# Patient Record
Sex: Female | Born: 1955 | Race: Black or African American | Hispanic: No | Marital: Single | Smoking: Former smoker
Health system: Southern US, Community
[De-identification: ages and names within clinical notes are randomized; demographics above are authoritative.]

## PROBLEM LIST (undated history)

## (undated) DIAGNOSIS — J45909 Unspecified asthma, uncomplicated: Secondary | ICD-10-CM

## (undated) DIAGNOSIS — I10 Essential (primary) hypertension: Secondary | ICD-10-CM

## (undated) DIAGNOSIS — K219 Gastro-esophageal reflux disease without esophagitis: Secondary | ICD-10-CM

## (undated) DIAGNOSIS — F329 Major depressive disorder, single episode, unspecified: Secondary | ICD-10-CM

## (undated) DIAGNOSIS — M549 Dorsalgia, unspecified: Secondary | ICD-10-CM

## (undated) DIAGNOSIS — F32A Depression, unspecified: Secondary | ICD-10-CM

## (undated) DIAGNOSIS — G8929 Other chronic pain: Secondary | ICD-10-CM

## (undated) HISTORY — DX: Essential (primary) hypertension: I10

## (undated) HISTORY — DX: Dorsalgia, unspecified: M54.9

## (undated) HISTORY — DX: Other chronic pain: G89.29

## (undated) HISTORY — PX: ABDOMINAL HYSTERECTOMY: SHX81

## (undated) HISTORY — DX: Depression, unspecified: F32.A

## (undated) HISTORY — PX: SHOULDER SURGERY: SHX246

## (undated) HISTORY — PX: OTHER SURGICAL HISTORY: SHX169

## (undated) HISTORY — DX: Unspecified asthma, uncomplicated: J45.909

## (undated) HISTORY — PX: LUMBAR FUSION: SHX111

## (undated) HISTORY — DX: Gastro-esophageal reflux disease without esophagitis: K21.9

## (undated) HISTORY — DX: Major depressive disorder, single episode, unspecified: F32.9

---

## 1998-02-03 ENCOUNTER — Emergency Department (HOSPITAL_COMMUNITY): Admission: EM | Admit: 1998-02-03 | Discharge: 1998-02-03 | Payer: Self-pay | Admitting: Emergency Medicine

## 1998-02-04 ENCOUNTER — Emergency Department (HOSPITAL_COMMUNITY): Admission: EM | Admit: 1998-02-04 | Discharge: 1998-02-04 | Payer: Self-pay | Admitting: Emergency Medicine

## 1998-02-08 ENCOUNTER — Ambulatory Visit (HOSPITAL_COMMUNITY): Admission: RE | Admit: 1998-02-08 | Discharge: 1998-02-08 | Payer: Self-pay | Admitting: Cardiology

## 1998-02-15 ENCOUNTER — Other Ambulatory Visit: Admission: RE | Admit: 1998-02-15 | Discharge: 1998-02-15 | Payer: Self-pay | Admitting: Obstetrics and Gynecology

## 1998-09-21 ENCOUNTER — Inpatient Hospital Stay (HOSPITAL_COMMUNITY): Admission: RE | Admit: 1998-09-21 | Discharge: 1998-09-23 | Payer: Self-pay | Admitting: Obstetrics and Gynecology

## 1998-09-29 ENCOUNTER — Encounter: Payer: Self-pay | Admitting: Emergency Medicine

## 1998-09-29 ENCOUNTER — Inpatient Hospital Stay (HOSPITAL_COMMUNITY): Admission: EM | Admit: 1998-09-29 | Discharge: 1998-10-10 | Payer: Self-pay | Admitting: Emergency Medicine

## 1998-09-30 ENCOUNTER — Encounter: Payer: Self-pay | Admitting: Obstetrics & Gynecology

## 1998-10-05 ENCOUNTER — Encounter: Payer: Self-pay | Admitting: Obstetrics and Gynecology

## 1998-10-06 ENCOUNTER — Encounter: Payer: Self-pay | Admitting: Obstetrics and Gynecology

## 1999-05-23 ENCOUNTER — Ambulatory Visit (HOSPITAL_COMMUNITY): Admission: RE | Admit: 1999-05-23 | Discharge: 1999-05-23 | Payer: Self-pay | Admitting: Orthopedic Surgery

## 1999-05-23 ENCOUNTER — Encounter: Payer: Self-pay | Admitting: Orthopedic Surgery

## 1999-06-04 DIAGNOSIS — J45909 Unspecified asthma, uncomplicated: Secondary | ICD-10-CM | POA: Insufficient documentation

## 1999-06-18 ENCOUNTER — Encounter: Payer: Self-pay | Admitting: Orthopedic Surgery

## 1999-06-18 ENCOUNTER — Ambulatory Visit (HOSPITAL_COMMUNITY): Admission: RE | Admit: 1999-06-18 | Discharge: 1999-06-18 | Payer: Self-pay | Admitting: Orthopedic Surgery

## 1999-07-02 ENCOUNTER — Ambulatory Visit (HOSPITAL_COMMUNITY): Admission: RE | Admit: 1999-07-02 | Discharge: 1999-07-02 | Payer: Self-pay | Admitting: Orthopedic Surgery

## 1999-07-02 ENCOUNTER — Encounter: Payer: Self-pay | Admitting: Orthopedic Surgery

## 1999-07-16 ENCOUNTER — Encounter: Payer: Self-pay | Admitting: Orthopedic Surgery

## 1999-07-16 ENCOUNTER — Emergency Department (HOSPITAL_COMMUNITY): Admission: EM | Admit: 1999-07-16 | Discharge: 1999-07-16 | Payer: Self-pay | Admitting: Emergency Medicine

## 1999-07-16 ENCOUNTER — Ambulatory Visit (HOSPITAL_COMMUNITY): Admission: RE | Admit: 1999-07-16 | Discharge: 1999-07-16 | Payer: Self-pay | Admitting: Orthopedic Surgery

## 1999-07-24 ENCOUNTER — Encounter: Payer: Self-pay | Admitting: Physical Therapy

## 1999-07-24 ENCOUNTER — Ambulatory Visit (HOSPITAL_COMMUNITY): Admission: RE | Admit: 1999-07-24 | Discharge: 1999-07-24 | Payer: Self-pay | Admitting: Physical Therapy

## 1999-08-22 ENCOUNTER — Encounter: Payer: Self-pay | Admitting: Internal Medicine

## 1999-08-29 ENCOUNTER — Encounter: Payer: Self-pay | Admitting: Orthopedic Surgery

## 1999-09-04 ENCOUNTER — Inpatient Hospital Stay (HOSPITAL_COMMUNITY): Admission: RE | Admit: 1999-09-04 | Discharge: 1999-09-08 | Payer: Self-pay | Admitting: Orthopedic Surgery

## 1999-09-04 ENCOUNTER — Encounter: Payer: Self-pay | Admitting: Orthopedic Surgery

## 1999-12-21 ENCOUNTER — Emergency Department (HOSPITAL_COMMUNITY): Admission: EM | Admit: 1999-12-21 | Discharge: 1999-12-21 | Payer: Self-pay | Admitting: Emergency Medicine

## 2001-06-29 ENCOUNTER — Encounter: Admission: RE | Admit: 2001-06-29 | Discharge: 2001-09-27 | Payer: Self-pay | Admitting: Orthopedic Surgery

## 2001-07-24 ENCOUNTER — Encounter: Payer: Self-pay | Admitting: Orthopedic Surgery

## 2001-07-24 ENCOUNTER — Ambulatory Visit (HOSPITAL_COMMUNITY): Admission: RE | Admit: 2001-07-24 | Discharge: 2001-07-24 | Payer: Self-pay | Admitting: Orthopedic Surgery

## 2001-09-10 ENCOUNTER — Emergency Department (HOSPITAL_COMMUNITY): Admission: EM | Admit: 2001-09-10 | Discharge: 2001-09-10 | Payer: Self-pay | Admitting: Emergency Medicine

## 2002-02-26 ENCOUNTER — Emergency Department (HOSPITAL_COMMUNITY): Admission: EM | Admit: 2002-02-26 | Discharge: 2002-02-26 | Payer: Self-pay | Admitting: Emergency Medicine

## 2002-02-26 ENCOUNTER — Encounter: Payer: Self-pay | Admitting: Emergency Medicine

## 2002-08-01 ENCOUNTER — Emergency Department (HOSPITAL_COMMUNITY): Admission: EM | Admit: 2002-08-01 | Discharge: 2002-08-01 | Payer: Self-pay | Admitting: Emergency Medicine

## 2002-10-11 ENCOUNTER — Encounter: Admission: RE | Admit: 2002-10-11 | Discharge: 2002-10-11 | Payer: Self-pay | Admitting: Internal Medicine

## 2002-10-11 ENCOUNTER — Encounter: Payer: Self-pay | Admitting: Internal Medicine

## 2002-12-16 ENCOUNTER — Encounter: Payer: Self-pay | Admitting: Internal Medicine

## 2002-12-16 HISTORY — PX: ESOPHAGOGASTRODUODENOSCOPY: SHX1529

## 2003-03-07 ENCOUNTER — Emergency Department (HOSPITAL_COMMUNITY): Admission: EM | Admit: 2003-03-07 | Discharge: 2003-03-08 | Payer: Self-pay | Admitting: Emergency Medicine

## 2003-03-07 ENCOUNTER — Encounter: Payer: Self-pay | Admitting: Emergency Medicine

## 2003-03-08 ENCOUNTER — Encounter: Payer: Self-pay | Admitting: Internal Medicine

## 2004-08-02 ENCOUNTER — Ambulatory Visit (HOSPITAL_BASED_OUTPATIENT_CLINIC_OR_DEPARTMENT_OTHER): Admission: RE | Admit: 2004-08-02 | Discharge: 2004-08-02 | Payer: Self-pay | Admitting: Otolaryngology

## 2004-08-02 ENCOUNTER — Ambulatory Visit: Payer: Self-pay | Admitting: Internal Medicine

## 2004-08-24 ENCOUNTER — Ambulatory Visit: Payer: Self-pay | Admitting: Critical Care Medicine

## 2004-09-21 ENCOUNTER — Ambulatory Visit (HOSPITAL_COMMUNITY): Admission: RE | Admit: 2004-09-21 | Discharge: 2004-09-22 | Payer: Self-pay | Admitting: Otolaryngology

## 2004-09-21 ENCOUNTER — Encounter (INDEPENDENT_AMBULATORY_CARE_PROVIDER_SITE_OTHER): Payer: Self-pay | Admitting: *Deleted

## 2005-02-07 ENCOUNTER — Ambulatory Visit: Payer: Self-pay | Admitting: Internal Medicine

## 2005-04-05 ENCOUNTER — Emergency Department (HOSPITAL_COMMUNITY): Admission: EM | Admit: 2005-04-05 | Discharge: 2005-04-05 | Payer: Self-pay | Admitting: Emergency Medicine

## 2005-06-28 ENCOUNTER — Ambulatory Visit (HOSPITAL_COMMUNITY): Admission: RE | Admit: 2005-06-28 | Discharge: 2005-06-28 | Payer: Self-pay | Admitting: Neurosurgery

## 2005-08-05 ENCOUNTER — Ambulatory Visit: Payer: Self-pay | Admitting: Internal Medicine

## 2005-08-08 ENCOUNTER — Observation Stay (HOSPITAL_COMMUNITY): Admission: EM | Admit: 2005-08-08 | Discharge: 2005-08-08 | Payer: Self-pay | Admitting: Emergency Medicine

## 2005-08-14 ENCOUNTER — Ambulatory Visit: Payer: Self-pay | Admitting: Internal Medicine

## 2005-08-21 ENCOUNTER — Ambulatory Visit: Payer: Self-pay

## 2005-08-21 ENCOUNTER — Encounter: Payer: Self-pay | Admitting: Internal Medicine

## 2005-10-02 ENCOUNTER — Encounter: Payer: Self-pay | Admitting: Cardiology

## 2005-12-15 ENCOUNTER — Encounter: Payer: Self-pay | Admitting: Emergency Medicine

## 2005-12-15 ENCOUNTER — Inpatient Hospital Stay (HOSPITAL_COMMUNITY): Admission: AD | Admit: 2005-12-15 | Discharge: 2005-12-19 | Payer: Self-pay | Admitting: Internal Medicine

## 2005-12-17 ENCOUNTER — Ambulatory Visit: Payer: Self-pay | Admitting: Internal Medicine

## 2006-02-25 ENCOUNTER — Emergency Department (HOSPITAL_COMMUNITY): Admission: EM | Admit: 2006-02-25 | Discharge: 2006-02-25 | Payer: Self-pay | Admitting: Hematology & Oncology

## 2006-03-26 ENCOUNTER — Ambulatory Visit: Payer: Self-pay | Admitting: Internal Medicine

## 2006-11-28 ENCOUNTER — Emergency Department (HOSPITAL_COMMUNITY): Admission: EM | Admit: 2006-11-28 | Discharge: 2006-11-28 | Payer: Self-pay | Admitting: Emergency Medicine

## 2007-01-29 DIAGNOSIS — K219 Gastro-esophageal reflux disease without esophagitis: Secondary | ICD-10-CM | POA: Insufficient documentation

## 2007-01-29 DIAGNOSIS — D259 Leiomyoma of uterus, unspecified: Secondary | ICD-10-CM | POA: Insufficient documentation

## 2007-01-29 DIAGNOSIS — I10 Essential (primary) hypertension: Secondary | ICD-10-CM | POA: Insufficient documentation

## 2007-03-06 ENCOUNTER — Telehealth: Payer: Self-pay | Admitting: Internal Medicine

## 2007-05-01 IMAGING — CR DG CHEST 2V
2 series · 2 of 2 positions shown · non-contrast
Comparison: 08/07/05.

CLINICAL DATA: Dyspnea. Cough and congestion. 
 CHEST - 2 VIEW:

[w chest pa]
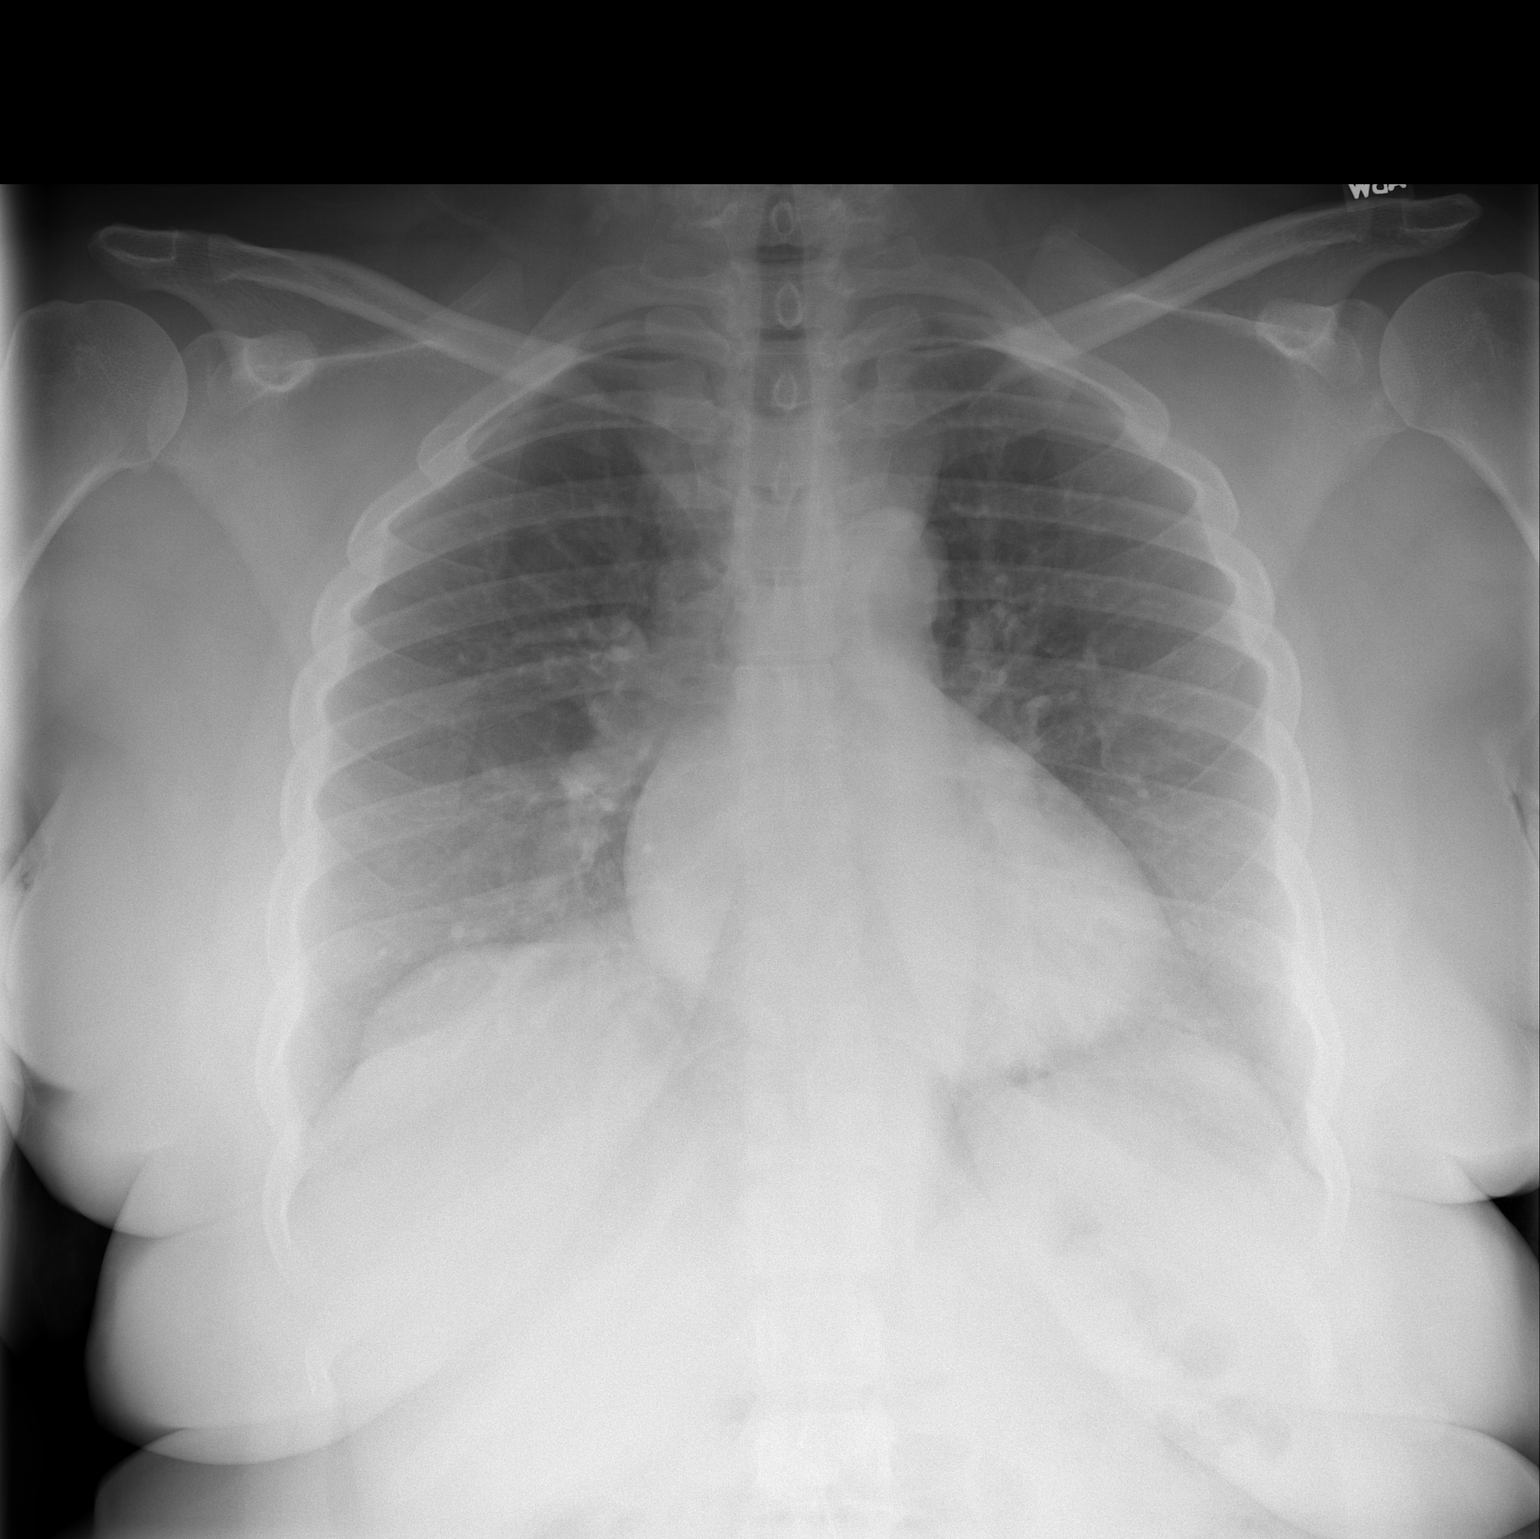

[w chest lat]
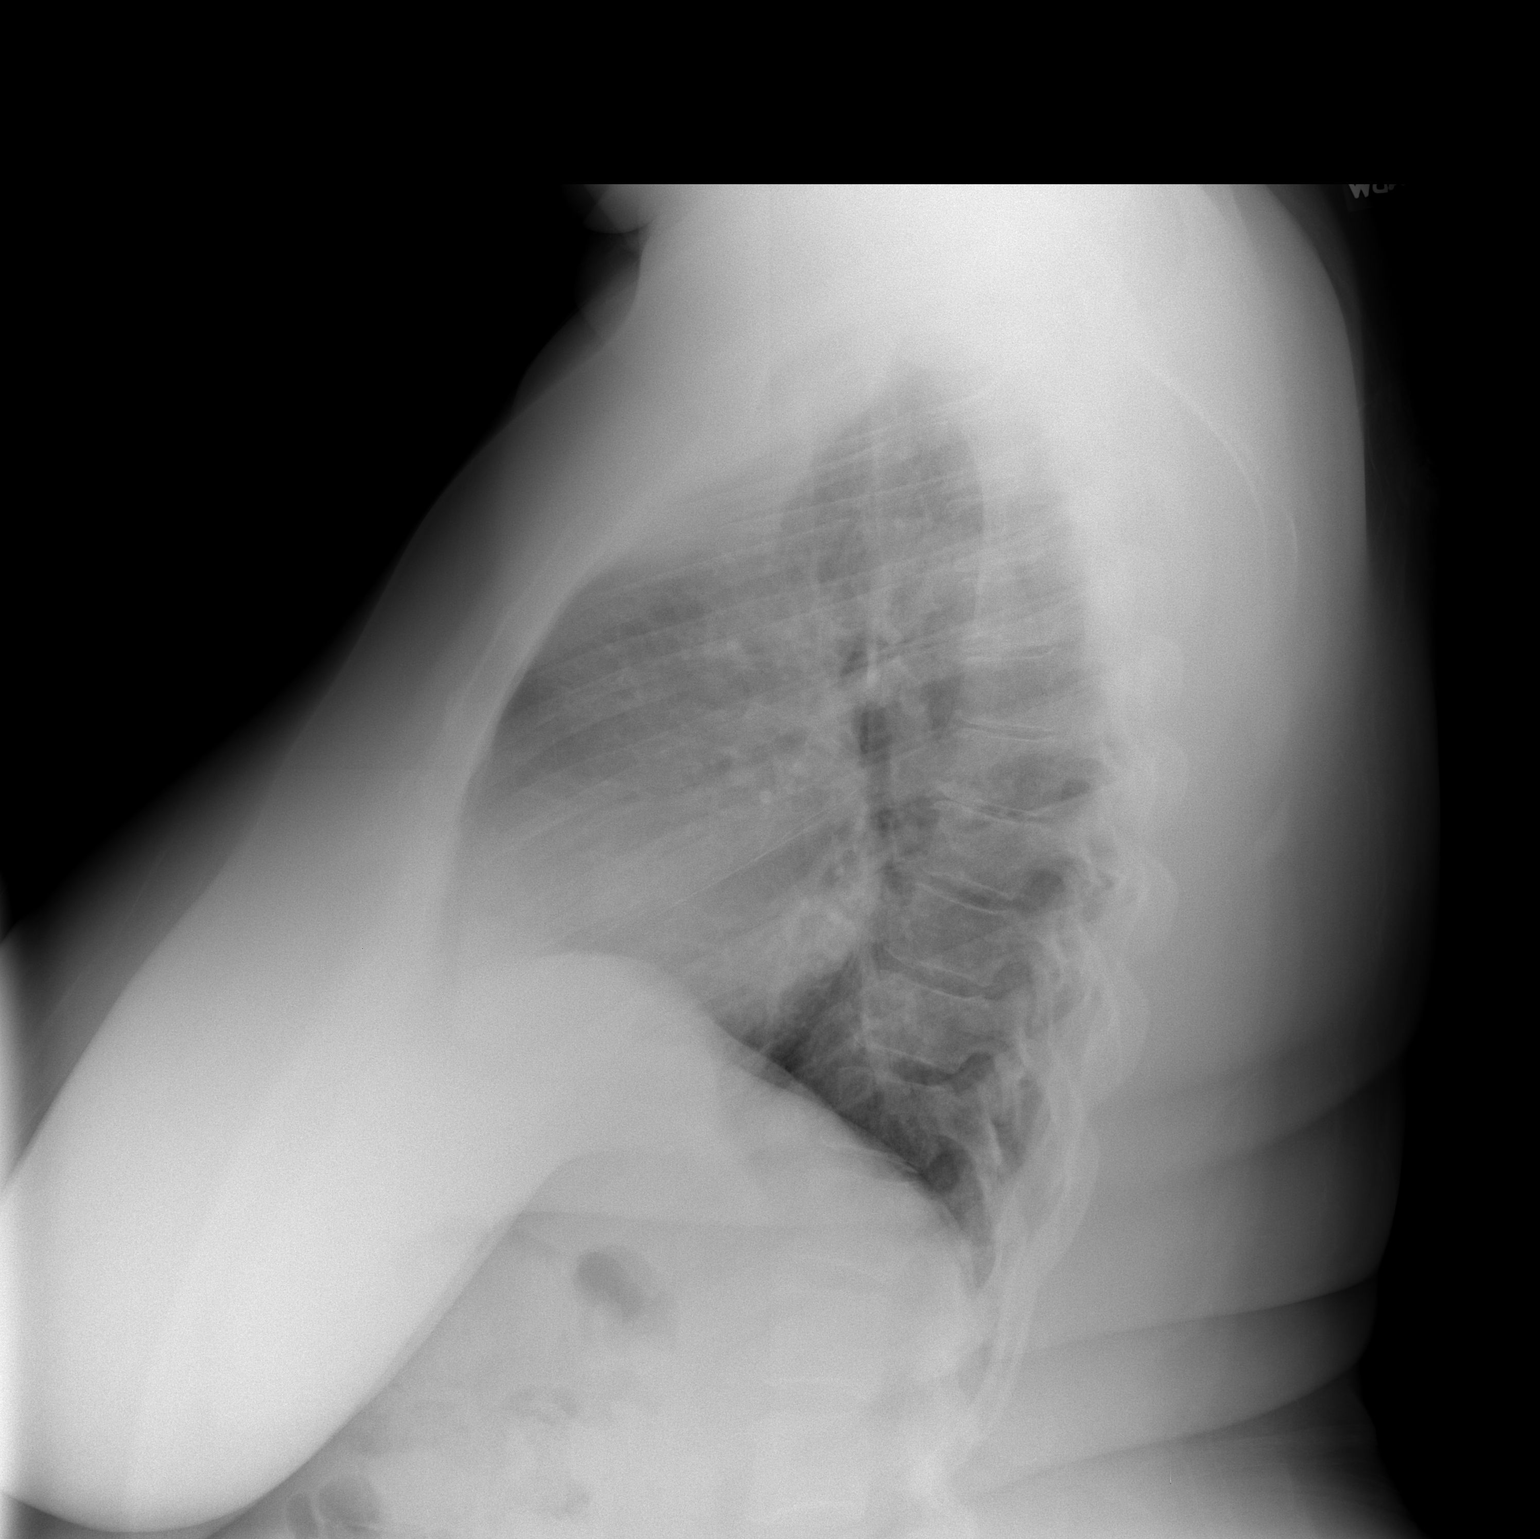

[2 of 2 positions shown; findings below may reference images not displayed]

FINDINGS: Enlarged cardiopericardial silhouette is again noted.  Mild peribronchial thickening is noted without focal air space disease.  The lungs are otherwise clear.  No evidence of pneumothorax.  The visualized upper abdomen and bony thorax are within normal limits.
IMPRESSION: 1.  Mild peribronchial thickening without focal air space disease.  
 2.  Upper limits of normal heart size.

## 2007-05-03 IMAGING — CR DG CHEST 2V
2 series · 2 of 2 positions shown · non-contrast
Comparison: 12/15/05.

CLINICAL DATA: Acute bronchitis with spasm.
CHEST - 2 VIEW:

[w chest pa]
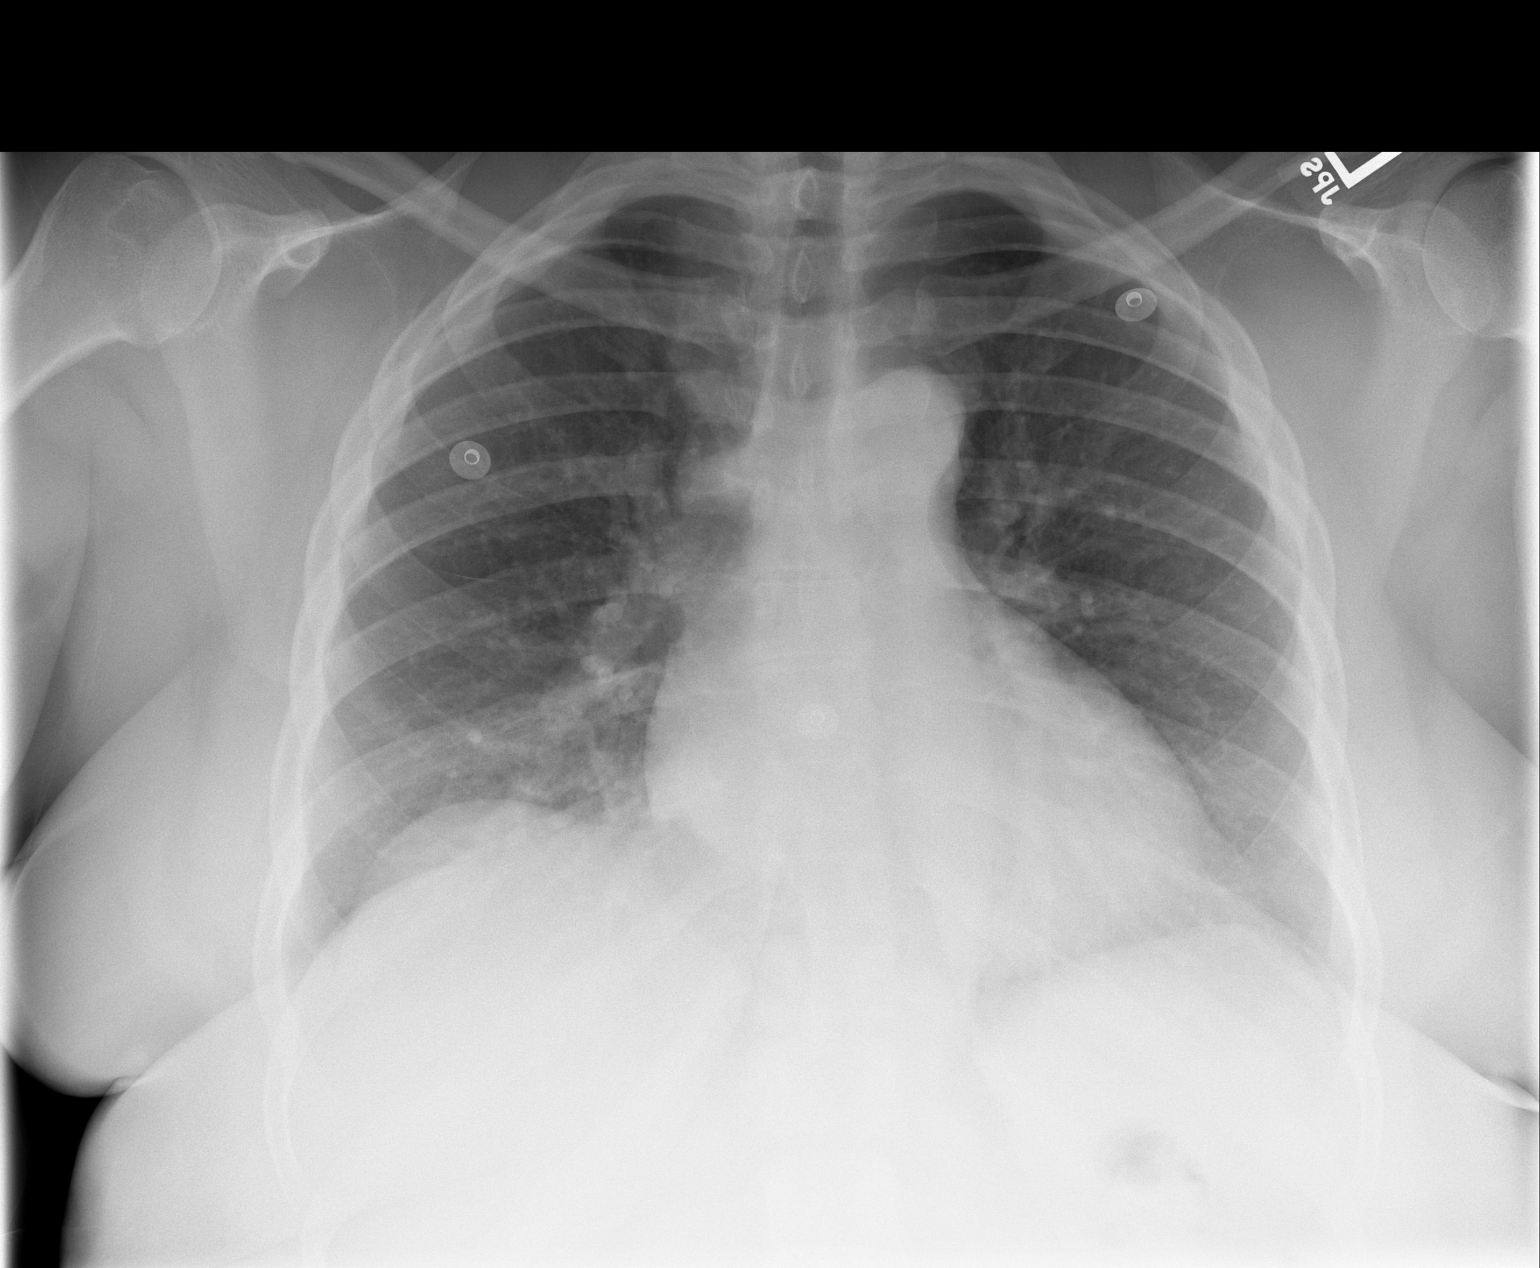

[w chest lat]
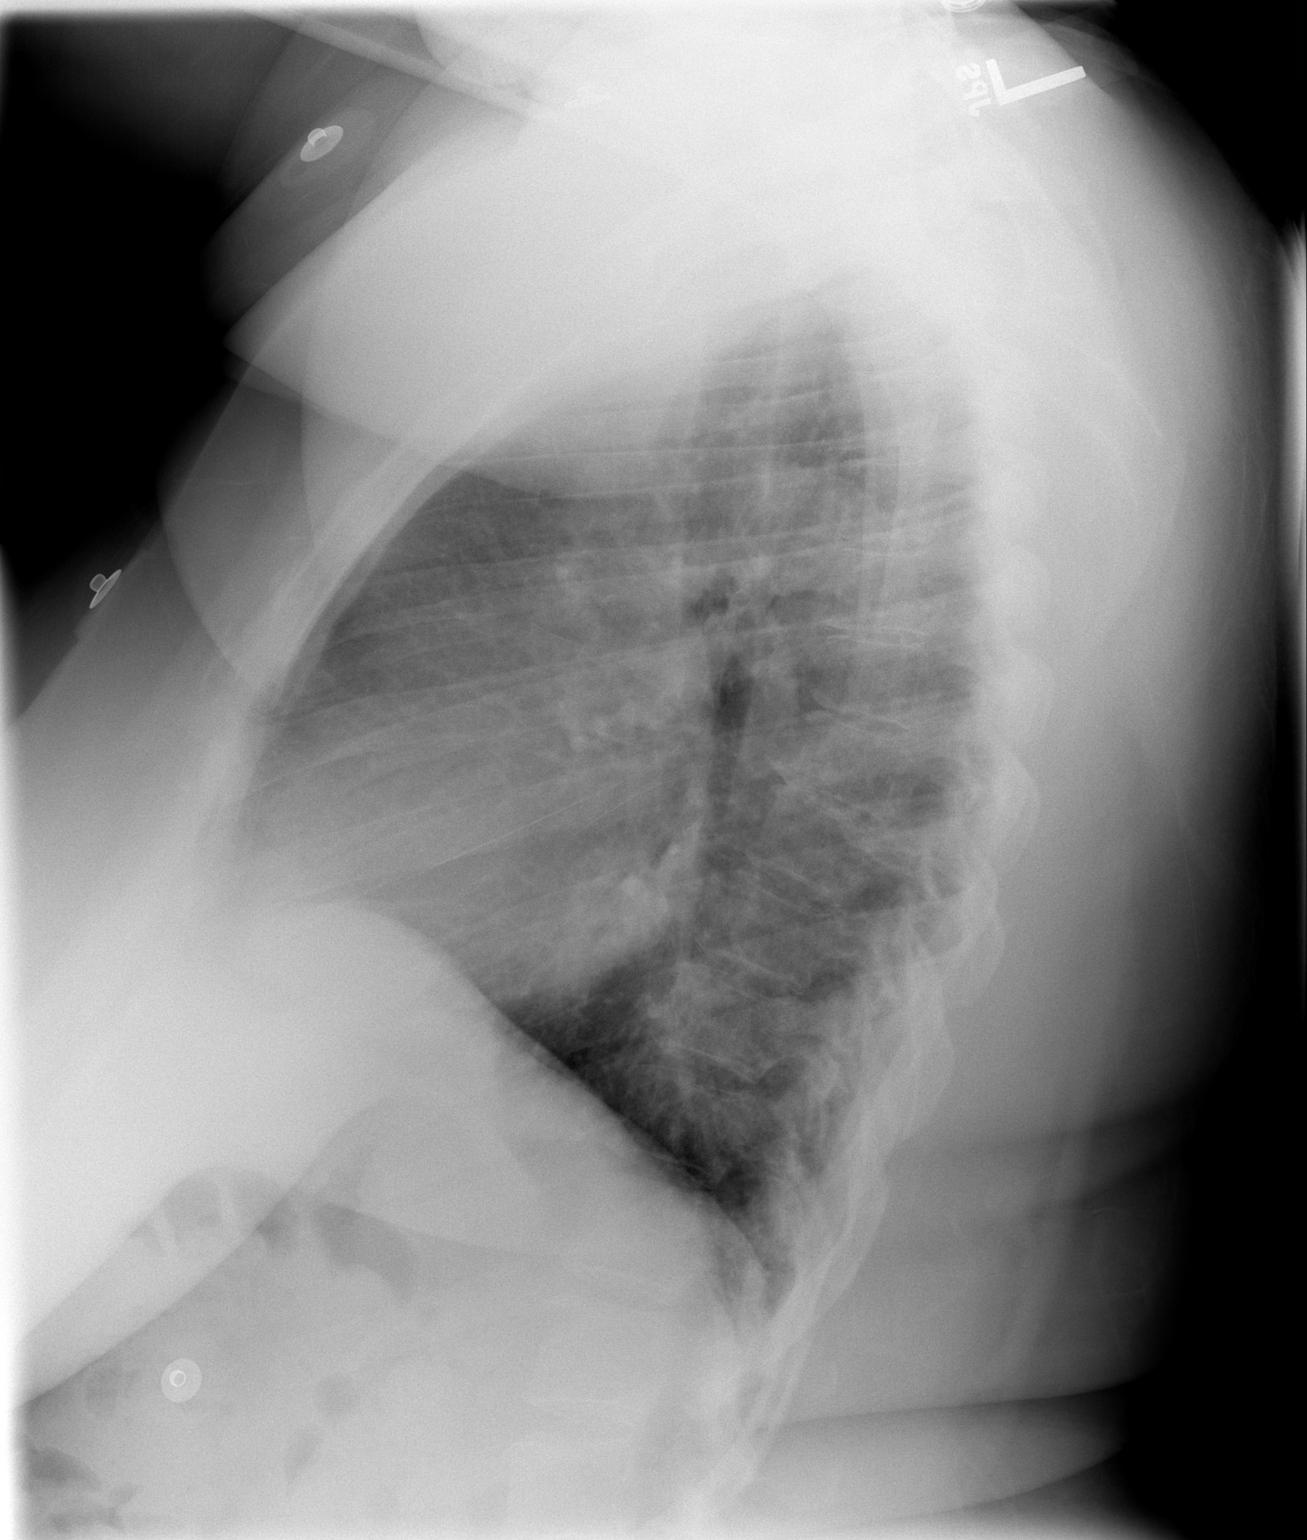

[2 of 2 positions shown; findings below may reference images not displayed]

FINDINGS: Two views of the chest were obtained. Lungs remain clear without focal disease.  Again seen is mild peribronchial thickening. Trachea is midline. Slight fullness in the superior mediastinal structures are unchanged.
IMPRESSION: Stable chest findings without focal disease.

## 2007-07-15 ENCOUNTER — Ambulatory Visit: Payer: Self-pay | Admitting: Internal Medicine

## 2007-07-15 DIAGNOSIS — E669 Obesity, unspecified: Secondary | ICD-10-CM | POA: Insufficient documentation

## 2007-07-17 LAB — CONVERTED CEMR LAB
BUN: 12 mg/dL (ref 6–23)
CO2: 27 meq/L (ref 19–32)
Calcium: 9.4 mg/dL (ref 8.4–10.5)
Chloride: 109 meq/L (ref 96–112)
Cholesterol: 191 mg/dL (ref 0–200)
Creatinine, Ser: 1 mg/dL (ref 0.4–1.2)
GFR calc Af Amer: 75 mL/min
GFR calc non Af Amer: 62 mL/min
Glucose, Bld: 138 mg/dL — ABNORMAL HIGH (ref 70–99)
HDL: 44.8 mg/dL (ref >39.0)
LDL Cholesterol: 116 mg/dL — ABNORMAL HIGH (ref 0–99)
Potassium: 3.9 meq/L (ref 3.5–5.1)
Sodium: 141 meq/L (ref 135–145)
Total CHOL/HDL Ratio: 4.3
Triglycerides: 150 mg/dL — ABNORMAL HIGH (ref 0–149)
VLDL: 30 mg/dL (ref 0–40)

## 2007-08-05 ENCOUNTER — Encounter: Payer: Self-pay | Admitting: Internal Medicine

## 2007-09-28 ENCOUNTER — Telehealth: Payer: Self-pay | Admitting: Internal Medicine

## 2007-10-29 ENCOUNTER — Encounter: Payer: Self-pay | Admitting: Internal Medicine

## 2007-12-10 ENCOUNTER — Telehealth: Payer: Self-pay | Admitting: Internal Medicine

## 2008-02-17 ENCOUNTER — Inpatient Hospital Stay (HOSPITAL_COMMUNITY): Admission: EM | Admit: 2008-02-17 | Discharge: 2008-02-18 | Payer: Self-pay | Admitting: Internal Medicine

## 2008-02-17 ENCOUNTER — Ambulatory Visit: Payer: Self-pay | Admitting: Internal Medicine

## 2008-02-17 ENCOUNTER — Encounter: Payer: Self-pay | Admitting: Emergency Medicine

## 2008-03-10 ENCOUNTER — Emergency Department (HOSPITAL_BASED_OUTPATIENT_CLINIC_OR_DEPARTMENT_OTHER): Admission: EM | Admit: 2008-03-10 | Discharge: 2008-03-10 | Payer: Self-pay | Admitting: Emergency Medicine

## 2008-07-06 ENCOUNTER — Ambulatory Visit: Payer: Self-pay | Admitting: Internal Medicine

## 2008-07-06 ENCOUNTER — Inpatient Hospital Stay (HOSPITAL_COMMUNITY): Admission: EM | Admit: 2008-07-06 | Discharge: 2008-07-08 | Payer: Self-pay | Admitting: Emergency Medicine

## 2008-10-22 ENCOUNTER — Encounter: Payer: Self-pay | Admitting: Internal Medicine

## 2009-01-07 ENCOUNTER — Emergency Department (HOSPITAL_COMMUNITY): Admission: EM | Admit: 2009-01-07 | Discharge: 2009-01-07 | Payer: Self-pay | Admitting: Emergency Medicine

## 2009-01-07 ENCOUNTER — Emergency Department (HOSPITAL_COMMUNITY): Admission: EM | Admit: 2009-01-07 | Discharge: 2009-01-07 | Payer: Self-pay | Admitting: Family Medicine

## 2009-01-11 ENCOUNTER — Ambulatory Visit: Payer: Self-pay | Admitting: Internal Medicine

## 2009-01-13 ENCOUNTER — Telehealth: Payer: Self-pay | Admitting: Internal Medicine

## 2009-02-10 ENCOUNTER — Ambulatory Visit: Payer: Self-pay | Admitting: Internal Medicine

## 2009-12-08 ENCOUNTER — Ambulatory Visit: Payer: Self-pay | Admitting: Diagnostic Radiology

## 2009-12-08 ENCOUNTER — Emergency Department (HOSPITAL_BASED_OUTPATIENT_CLINIC_OR_DEPARTMENT_OTHER): Admission: EM | Admit: 2009-12-08 | Discharge: 2009-12-08 | Payer: Self-pay | Admitting: Emergency Medicine

## 2009-12-12 ENCOUNTER — Telehealth: Payer: Self-pay | Admitting: Internal Medicine

## 2010-02-22 ENCOUNTER — Telehealth: Payer: Self-pay | Admitting: *Deleted

## 2010-04-02 ENCOUNTER — Encounter: Payer: Self-pay | Admitting: Internal Medicine

## 2010-07-05 NOTE — Progress Notes (Signed)
Summary: refill  Phone Note From Pharmacy   Caller: [facility] [address] #[id number]* Reason for Call: Needs renewal Details for Reason: Omeprazole Initial call taken by: Romualdo Bolk, CMA (AAMA),  February 22, 2010 12:56 PM    Prescriptions: OMEPRAZOLE 20 MG CPDR (OMEPRAZOLE) Take1 tabs daily  #30 x 0   Entered by:   Romualdo Bolk, CMA (AAMA)   Authorized by:   Birdie Sons MD   Signed by:   Romualdo Bolk, CMA (AAMA) on 02/22/2010   Method used:   Electronically to        [facility] [address] #[id number]* (retail)       [address] [place], [place]  [address]       Ph: [phone]       Fax: [phone]   RxID:   [id number]

## 2010-07-05 NOTE — Letter (Signed)
Summary: [facility]  [facility]   Imported By: Maryln Gottron 04/13/2010 14:57:13  _____________________________________________________________________  External Attachment:    Type:   Image     Comment:   External Document

## 2010-07-05 NOTE — Progress Notes (Signed)
Summary: sinus  Phone Note Call from Patient   Caller: Patient Call For: Birdie Sons MD Summary of Call: Pt is complaining of nasal congestion and clear drainage. Headache.  No fever. Nausea from drainage.  Taking OTC sinus meds. and are not helping.  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 [phone] Initial call taken by: Lynann Beaver CMA,  December 12, 2009 10:44 AM  Follow-up for Phone Call        mucinex dm two times a day for 10 days and fluticasone NS  1 spray each nostril daily for 2 weeks Follow-up by: Birdie Sons MD,  December 12, 2009 2:07 PM    New/Updated Medications: MUCINEX DM 30-600 MG XR12H-TAB (DEXTROMETHORPHAN-GUAIFENESIN) one by mouth bid FLUTICASONE PROPIONATE 50 MCG/ACT SUSP (FLUTICASONE PROPIONATE) One spray each nostril daily x 2 weeks Prescriptions: FLUTICASONE PROPIONATE 50 MCG/ACT SUSP (FLUTICASONE PROPIONATE) One spray each nostril daily x 2 weeks  #1 bottle x 0   Entered by:   Lynann Beaver CMA   Authorized by:   Birdie Sons MD   Signed by:   Lynann Beaver CMA on 12/12/2009   Method used:   Electronically to        [facility] [address] #[id number]* (retail)       [address] [place], [place]  [address]       Ph: [phone]       Fax: [phone]   RxID:   [id number] MUCINEX DM 30-600 MG XR12H-TAB (DEXTROMETHORPHAN-GUAIFENESIN) one by mouth bid  #20 x 0   Entered by:   Lynann Beaver CMA   Authorized by:   Birdie Sons MD   Signed by:   Lynann Beaver CMA on 12/12/2009   Method used:   Electronically to        [facility] #[id number]* (retail)       [address] [place], [place]  [address]       Ph: [phone]       Fax: [phone]   RxID:   [id number]

## 2010-09-09 LAB — BASIC METABOLIC PANEL
BUN: 10 mg/dL (ref 6–23)
CO2: 29 mEq/L (ref 19–32)
Calcium: 9.3 mg/dL (ref 8.4–10.5)
Chloride: 106 mEq/L (ref 96–112)
Creatinine, Ser: 0.92 mg/dL (ref 0.4–1.2)
GFR calc Af Amer: >60 mL/min (ref >60)
GFR calc non Af Amer: >60 mL/min (ref >60)
Glucose, Bld: 83 mg/dL (ref 70–99)
Potassium: 3.6 mEq/L (ref 3.5–5.1)
Sodium: 141 mEq/L (ref 135–145)

## 2010-09-09 LAB — POCT CARDIAC MARKERS
CKMB, poc: 1.4 ng/mL (ref 1.0–8.0)
Myoglobin, poc: 58.5 ng/mL (ref 12–200)
Troponin i, poc: <0.05 ng/mL (ref 0.00–0.09)

## 2010-09-09 LAB — CBC
HCT: 43.9 % (ref 36.0–46.0)
Hemoglobin: 14.2 g/dL (ref 12.0–15.0)
MCHC: 32.4 g/dL (ref 30.0–36.0)
MCV: 81.8 fL (ref 78.0–100.0)
Platelets: 268 10*3/uL (ref 150–400)
RBC: 5.36 MIL/uL — ABNORMAL HIGH (ref 3.87–5.11)
RDW: 15.2 % (ref 11.5–15.5)
WBC: 12.6 10*3/uL — ABNORMAL HIGH (ref 4.0–10.5)

## 2010-09-09 LAB — DIFFERENTIAL
Basophils Absolute: 0.1 10*3/uL (ref 0.0–0.1)
Basophils Relative: 1 % (ref 0–1)
Eosinophils Absolute: 0.3 10*3/uL (ref 0.0–0.7)
Eosinophils Relative: 2 % (ref 0–5)
Lymphocytes Relative: 24 % (ref 12–46)
Lymphs Abs: 3 10*3/uL (ref 0.7–4.0)
Monocytes Absolute: 0.9 10*3/uL (ref 0.1–1.0)
Monocytes Relative: 7 % (ref 3–12)
Neutro Abs: 8.4 10*3/uL — ABNORMAL HIGH (ref 1.7–7.7)
Neutrophils Relative %: 67 % (ref 43–77)

## 2010-09-09 LAB — POCT I-STAT, CHEM 8
BUN: 13 mg/dL (ref 6–23)
Calcium, Ion: 1.22 mmol/L (ref 1.12–1.32)
Chloride: 105 mEq/L (ref 96–112)
Creatinine, Ser: 1.1 mg/dL (ref 0.4–1.2)
Glucose, Bld: 83 mg/dL (ref 70–99)
HCT: 47 % — ABNORMAL HIGH (ref 36.0–46.0)
Hemoglobin: 16 g/dL — ABNORMAL HIGH (ref 12.0–15.0)
Potassium: 3.7 mEq/L (ref 3.5–5.1)
Sodium: 143 mEq/L (ref 135–145)
TCO2: 28 mmol/L (ref 0–100)

## 2010-09-18 LAB — PROTIME-INR
INR: 1 (ref 0.00–1.49)
Prothrombin Time: 13.6 seconds (ref 11.6–15.2)

## 2010-09-18 LAB — BASIC METABOLIC PANEL
BUN: 13 mg/dL (ref 6–23)
CO2: 25 mEq/L (ref 19–32)
Calcium: 9.1 mg/dL (ref 8.4–10.5)
Chloride: 102 mEq/L (ref 96–112)
Creatinine, Ser: 1.2 mg/dL (ref 0.4–1.2)
GFR calc Af Amer: 57 mL/min — ABNORMAL LOW (ref >60)
GFR calc non Af Amer: 47 mL/min — ABNORMAL LOW (ref >60)
Glucose, Bld: 96 mg/dL (ref 70–99)
Potassium: 3.5 mEq/L (ref 3.5–5.1)
Sodium: 137 mEq/L (ref 135–145)

## 2010-09-18 LAB — BLOOD GAS, ARTERIAL
Acid-base deficit: 1 mmol/L (ref 0.0–2.0)
TCO2: 20.8 mmol/L (ref 0–100)
pCO2 arterial: 38.7 mmHg (ref 35.0–45.0)
pH, Arterial: 7.393 (ref 7.350–7.400)
pO2, Arterial: 94.2 mmHg (ref 80.0–100.0)

## 2010-09-18 LAB — CBC
MCHC: 33 g/dL (ref 30.0–36.0)
MCV: 81.8 fL (ref 78.0–100.0)
Platelets: 303 10*3/uL (ref 150–400)
RBC: 5.11 MIL/uL (ref 3.87–5.11)
RDW: 13.3 % (ref 11.5–15.5)

## 2010-09-18 LAB — CARDIAC PANEL(CRET KIN+CKTOT+MB+TROPI)
CK, MB: 4.7 ng/mL — ABNORMAL HIGH (ref 0.3–4.0)
CK, MB: 5.9 ng/mL — ABNORMAL HIGH (ref 0.3–4.0)
Total CK: 688 U/L — ABNORMAL HIGH (ref 7–177)
Total CK: 698 U/L — ABNORMAL HIGH (ref 7–177)

## 2010-09-18 LAB — DIFFERENTIAL
Basophils Absolute: 0.1 10*3/uL (ref 0.0–0.1)
Basophils Relative: 1 % (ref 0–1)
Eosinophils Absolute: 0.2 10*3/uL (ref 0.0–0.7)
Monocytes Relative: 5 % (ref 3–12)
Neutro Abs: 8.9 10*3/uL — ABNORMAL HIGH (ref 1.7–7.7)
Neutrophils Relative %: 70 % (ref 43–77)

## 2010-09-18 LAB — POCT CARDIAC MARKERS

## 2010-09-18 LAB — D-DIMER, QUANTITATIVE: D-Dimer, Quant: 0.25 ug/mL-FEU (ref 0.00–0.48)

## 2010-09-18 LAB — MAGNESIUM: Magnesium: 2.3 mg/dL (ref 1.5–2.5)

## 2010-10-16 NOTE — H&P (Signed)
NAMEBLAKE, Sheena             ACCOUNT NO.:  [id number]   MEDICAL RECORD NO.:  [id number]          PATIENT TYPE:  INP   LOCATION:  6533                         FACILITY:  [facility]   PHYSICIAN:  Michiel Cowboy, MDDATE OF BIRTH:  02/18/1956   DATE OF ADMISSION:  02/17/2008  DATE OF DISCHARGE:                              HISTORY & PHYSICAL   PRIMARY CARE PHYSICIAN:  Valetta Mole. Swords, MD.   CHIEF COMPLAINT:  Wheezing and cough.   The patient is a 55 year old female with history of asthma, hypertension  and GERD; who since Sunday, has been having first, rhinorrhea, sinus  pressure, but no fever.  Then starting from yesterday started to have  cough which is not productive, but cough kind of upper airway and what  was stridor-like symptoms which was intermittent.  The patient has been  coughing so much she has been nauseous and has had some vomiting; but no  fever, no shortness of breath.  She is able to carry a full conversation  and full sentences, but has hard time taking a deep breath that led to  coughing.  Otherwise, endorses occasional headaches.  No lower extremity  edema.  No wheezing or shortness of breath at baseline but has endorsed  she had been having wheezing with URIs for which the patient was given  albuterol inhaler p.r.n.   REVIEW OF SYSTEMS:  As per HPI, otherwise unremarkable -- 12 systems  reviewed.   PAST MEDICAL HISTORY:  1. GERD.  2. Hypertension, for which she is currently off medications.  3. History of back pain.  4. History of asthma.  5. History of sleep apnea in the past; had undergone tonsillectomy and      since then has not been using BiPAP.   SOCIAL HISTORY:  The patient does not drink; never smoked.  Lives at  home.  Has exposure to children and exposure to some children with URI  recently.   FAMILY HISTORY:  Noncontributory.   ALLERGIES:  NO KNOWN DRUG ALLERGIES.   MEDICATIONS:  1. Prilosec 20 mg once a day.  2. Albuterol as  needed.  3. Darvocet as needed for pain.   VITALS:  Temperature 97.9, blood pressure 06/1970, 1-0/7 respirations  16, satting 95% on room air.  Heart rate 105 and regular.   PHYSICAL EXAMINATION:  The patient is an obese female in no acute  distress; but frequent coughing.  Sitting down on bed.  HEENT:  Head:  Nontraumatic.  Somewhat dry mucous membranes.  NECK:  Supple.  No lymphadenopathy noted.  LUNGS:  When able to take a deep breath without coughing, actually has  fairly good air movement and no expiratory wheezing was noted.  When  coughing, she does have occasional inspiratory wheezes which are clearly  upper airway.  HEART:  Somewhat rapid but regular.  No murmurs, rubs or gallops.  ABDOMEN:  Obese but nondistended, soft, nontender.  LOWER EXTREMITIES:  Without edema.  NEUROLOGIC:  Intact.  Nonfocal.  No cerebellar signs.  Cranial nerves II-  XII intact.  Strength 5/5 in all 4 extremities.   LABS:  White  blood cell count 17.5, hemoglobin 15.1.  Sodium 146,  potassium 3.9, creatinine 0.9.  LFTs within normal limits.  UA showing  no evidence infection.  Chest x-ray showing bilateral atelectasis and  some interstitial congestion and poor inspiration.   ASSESSMENT AND PLAN:  This is a 55 year old female with history of  asthma and now with URI, which probably triggered asthma-like  exacerbations versus transient laryngospasm while coughing.  1. History of asthma with possible asthma exacerbation:  We will treat      symptomatically.  Will give anti-tussives and guaifenesin.  The      patient already received Solu-Medrol IM. Will start on prednisone      and albuterol nebulizers as needed; continuous pulse oximetry.  The      patient appears to be in no acute distress and likely will do well.      In this setting, I do not think this is a typical asthma      exacerbation.  But rather very severe episodes of coughing.  2. Gastroesophageal reflux disease:  We will give Protonix.   3. Prophylaxis:  Protonix plus early ambulation.  Will also write for      SCDs while in bed.  4. Hypertension.  Give p.r.n. hydralazine and do Norvasc.  5. Mild dehydration:  Per labs and physical examination; will check      orthostatics, give gentle IV hydration.  6. Leukocytosis:  Wonder if this is secondary to upper respiratory      infection.  We will follow at this point.  Will avoid antibiotics.      The patient does not have history of chronic obstructive pulmonary      disease .  Does not have an infiltrate on x-ray.  Will monitor.      Michiel Cowboy, MD  Electronically Signed     AVD/MEDQ  D:  02/17/2008  T:  02/18/2008  Job:  [id number]   cc:   Valetta Mole. Swords, MD

## 2010-10-16 NOTE — Discharge Summary (Signed)
Sheena Velasquez, Sheena Velasquez             ACCOUNT NO.:  [id number]   MEDICAL RECORD NO.:  [id number]          PATIENT TYPE:  INP   LOCATION:  1341                         FACILITY:  [facility]   PHYSICIAN:  Rosalyn Gess. Norins, MD  DATE OF BIRTH:  12/14/1955   DATE OF ADMISSION:  07/05/2008  DATE OF DISCHARGE:  07/08/2008                               DISCHARGE SUMMARY   ADMITTING DIAGNOSES:  1. Acute exacerbation of asthma with respiratory distress.  2. Chronic back pain.  3. Hypertension.   DISCHARGE DIAGNOSES:  1. Acute exacerbation, multifactorial, of asthma.  2. Cardiovascular, myocardial infarction ruled out with negative      cardiac enzymes.  3. Possible reflux.  4. Chronic back pain.  5. Hypertension.   CONSULTANTS:  Dr. Francella Solian for pulmonary critical care.   PROCEDURES:  1. Chest x-ray at admission which showed low lung volumes with      bibasilar atelectasis.  2. Followup chest x-ray which shows lightly better lung inflation with      bibasilar opacities that remain favoring atelectasis.   HISTORY OF THE PRESENT ILLNESS:  Sheena Velasquez is a 55 year old African  American woman with a history of asthma and hypertension who was at her  usual state of health until early on the day of admission.  She had been  cleaning snow from her car, developed the onset of severe shortness of  breath and wheezing.  Of note, later history indicated the patient has  been having some problems with respiratory function with increased  shortness of breath and wheezing for several weeks.  She has been using  albuterol metered-dose inhaler multiple times daily.  She used Advair  only on a p.r.n. basis for when she felt she was particularly short of  breath.  She reports that Advair has caused her to have constipation so  she avoided its use.   Because of her respiratory distress, she presented to the emergency  department where she had significant wheezing.  She was given a 1-hour  nebulizer  treatment and started on prednisone.  She continued to wheeze  and therefore was admitted to hospital to the step-down unit because of  respiratory distress and pending respiratory failure.   Please see the H and P for past medical history, family history, and  social history.   MEDICATIONS AT ADMISSION:  Included:  1. ProAir 2 puffs q.4 p.r.n.  2. Advair 100/50 b.i.d.  3. Felodipine 5 mg daily.   HOSPITAL COURSE:  1. Pulmonary.  Patient was admitted to a step-down unit.  She was      given IV Solu-Medrol initially at 60 mg q.8, advanced to 80 mg      daily.  She was started on Symbicort 80/4.5 two puffs b.i.d.  She      was given oxygen support.  Patient was seen in consultation by      pulmonary critical care who felt the patient had multifactorial      respiratory distress related in part to uncontrolled reflux,      possible upper respiratory infections, and poor compliance with her  medical regimen with lack of insight and understanding into her      medications.  The patient did well on a regimen of IV steroids and      long-acting bronchodilators.  By July 07, 2008, she was stable      to be transferred out of the medical unit.  Solu-Medrol is to be      tapered.  Patient continued to do well.  She had normal O2      saturation on room air.  Wheezing was resolved.  Patient had no      respiratory distress.  At this point, she is stable to be      discharged home to complete a regimen of steroids on a tapering      dose and to continue with maintenance therapy with bronchodilators      and p.r.n. rescue medications.  She will also be continued on high-      dose PPI and H2 blocker therapy until seen in followup.  Dr. Elesa Massed      has recommended Ultram as a cough suppressant.  2. Chest pain.  Patient did have chest pain thought to be in the chest      wall related to her coughing.  She had cardiac enzymes that were      negative..  3. Hypertension.  Patient had paroxysmal  hypertension treated with      p.r.n. clonidine.  Her home medication was continued.  At time of      discharge, she will resume her home medications and follow up with      Dr. Cato Mulligan.   DISCHARGE EXAMINATION:  Temperature was 97.6.  Blood pressure 162/91.  Heart rate 77.  Respirations were 20.  O2 saturations 98% on room air.  GENERAL APPEARANCE:  This is an obese African American woman in bed in  no acute distress.  HEENT EXAM:  Unremarkable.  NECK:  Supple.  CHEST:  Patient has no increased work of breathing.  LUNGS:  Clear with no rales or wheezes.  She has no prolonged expiratory  phase.  CARDIOVASCULAR:  Two-plus radial pulse.  Her precordium was quiet.  She  had a regular rate and rhythm without murmurs.   FINAL LABORATORY:  Cardiac panel negative x3 in regard to troponin.  She  did have a mildly elevated creatinine kinase thought to be due to muscle  strain.  Magnesium level, July 06, 2008, was normal at 2.3.  Basic  metabolic panel on July 06, 2008, with a sodium 137, potassium 3.5,  chloride 102, CO2 of 25, BUN of 13, creatinine 1.2, glucose was 96.  CBC  at admission with a hemoglobin of 13.8 g, white count was 12,800, D-  dimer was normal at 0.25.   DISCHARGE MEDICATIONS:  Patient will resume her home medications  including:  1. Felodipine 5 mg daily.  2. Darvocet as needed.  3. ProAir 2 puffs q.4 p.r.n..  4. We will add Symbicort 80/4.5 two puffs b.i.d.  5. Prednisone at a tapering dose starting at 40 mg daily for 3 days,      then 30 mg daily for 3 days, 20 mg daily for 3 days, 10 mg daily      for 6 days, and stop.  6. She will also take Ultram 50 mg t.i.d. until seen in followup.  7. She will complete Augmentin 875 mg b.i.d. for an additional 5 days      for possible upper respiratory infection.  8.  She will take omeprazole 40 mg b.i.d.  9. Famotidine 20 mg q.h.s.   DISPOSITION:  Patient is discharged to home.  She will need to see Dr.  Birdie Sons in  followup in approximately 10 to 12 days.  She is to call  the office for an appointment given the office is closed at the time of  this discharge dictation.   CONDITION AT TIME OF DISCHARGE:  Stable and improved.      Rosalyn Gess Norins, MD  Electronically Signed     MEN/MEDQ  D:  07/08/2008  T:  07/08/2008  Job:  [id number]   cc:   Valetta Mole. Swords, MD  [address] [place]  [place] [address]

## 2010-10-16 NOTE — Discharge Summary (Signed)
Sheena Velasquez, Sheena Velasquez             ACCOUNT NO.:  [id number]   MEDICAL RECORD NO.:  [id number]          PATIENT TYPE:  INP   LOCATION:  6533                         FACILITY:  [facility]   PHYSICIAN:  Valerie A. Felicity Coyer, MDDATE OF BIRTH:  Sep 16, 1955   DATE OF ADMISSION:  02/17/2008  DATE OF DISCHARGE:  02/18/2008                               DISCHARGE SUMMARY   PRIMARY CARE PHYSICIAN:  Valetta Mole. Swords, MD   DISCHARGE DIAGNOSES:  1. Acute asthma exacerbation in the setting of upper respiratory      infection.  2. Gastroesophageal reflux disease.  3. Hypertension.  4. Hyperglycemia.   HISTORY OF PRESENT ILLNESS:  Ms. Sheu is a very pleasant, 55-year-  old, African American female with past medical history of asthma and  hypertension who presented to [facility] Emergency Room on the evening  of admission with reports of shortness of breath.  The patient described  onset of rhinorrhea with sinus pressure and no fever, onset on Sunday,  prior to this admission and then began developing a nonproductive cough  followed by shortness of breath.  Upon evaluation in the ER, the patient  was found to have mild bilateral expiratory wheeze.  This is after  receiving breathing treatment and IV Solu-Medrol by emergency room  physician.  The patient was admitted at that time for further evaluation  and treatment.   PAST MEDICAL HISTORY:  1. GERD.  2. Hypertension.  No medications prior to this admission.  3. History of back pain.  4. Asthma.  5. History of sleep apnea, no longer on BiPAP after tonsillectomy.   COURSE OF HOSPITALIZATION:  1. Acute asthma exacerbation in the setting of cough and upper      respiratory infection.  A chest x-ray obtained at the time of      admission revealing bilateral atelectasis with some interstitial      congestion.  BNP obtained on at the time of admission and on day of      dictation was less than 30.  The patient responded well to      scheduled  nebulizer treatments as well as scheduled Advair and oral      steroids.  At this time, the patient felt medically stable for      discharge with no wheezing on exam.  The patient will be discharged      home on continued prednisone taper with Advair and albuterol rescue      inhaler.  Again, the patient has remained afebrile.  Chest x-ray      without any infection.  No signs or symptoms of infection on exam;      therefore, empiric antibiotic treatment was held during this      hospitalization.  2. Hypertension.  As mentioned above, the patient is not on any      antihypertensives prior to this admission; however, blood pressure      obtained on arrival to ER was 172/107.  The patient has been      started on low-dose Norvasc at this time to be continued at the  time of discharge and followed by primary care physician.  3. Hyperglycemia.  Likely exacerbated by steroids given in the      emergency room.  The patient's A1c obtained during this admission      is pending at the time of dictation to be followed up by primary      care physician.   MEDICATIONS:  At the time of discharge:  1. Advair 100/50 one inhalation b.i.d.  2. Norvasc 5 mg p.o. daily.  Note, this is a new medication.  3. Tussionex 1 teaspoon q.12 h. p.r.n. cough.  4. Prilosec 20 mg p.o. daily.  5. Albuterol inhaler 1-2 puffs q.4 h. p.r.n. shortness of breath.  6. Prednisone 10 mg tabs 6 tablets p.o. daily x3 days, 5 tablets p.o.      daily x3 days, 4 tablets daily x3 days, 3 tablets daily x3 days, 2      tablets daily x3 days, 1 tablet daily x3 days and stop.   DISPOSITION:  Again, the patient felt medically stable for discharge  home at this time as she is not endorsing any shortness of breath, and  physical exam is benign.  The patient is instructed to follow up with  her primary care physician, Dr. Birdie Sons on Wednesday 02/24/2008 at  12 p.m., at which time her hemoglobin A1c can be followed up as well as   the patient's blood pressure.      Cordelia Pen, NP      Raenette Rover. Felicity Coyer, MD  Electronically Signed    LE/MEDQ  D:  02/18/2008  T:  02/18/2008  Job:  [id number]   cc:   Valetta Mole. Swords, MD

## 2010-10-16 NOTE — H&P (Signed)
Sheena Velasquez, Sheena Velasquez             ACCOUNT NO.:  [id number]   MEDICAL RECORD NO.:  [id number]          PATIENT TYPE:  INP   LOCATION:  0101                         FACILITY:  [facility]   PHYSICIAN:  Sheena Velasquez, MDDATE OF BIRTH:  [birth year]   DATE OF ADMISSION:  07/05/2008  DATE OF DISCHARGE:                              HISTORY & PHYSICAL   PRIMARY CARE Sheena Velasquez:  Dr. Birdie Velasquez.   CHIEF COMPLAINT:  Wheezing.   The patient is a 55 year old female with history of known asthma and  hypertension who was at her baseline of health up until early today when  she was cleaning her car from snow developed sudden onset of severe  shortness of breath and wheezing.  She tried to use albuterol and tried  to use Advair which she has not been taking as prescribed without any  results, at which point she presented to the emergency department where  she was found to be still severely wheezing weak.  She got albuterol  nebulizers and 40 mg of prednisone and received continuous nebulizers  for 1 hour.  Her wheezing somewhat improved but still continued.  At  this point, Sheena Velasquez was called.  Otherwise, the patient  reports initially she was feeling very tight in her chest, but now this  has improved, although she is still wheezing.  No coughing.  No fevers.  No chills.  No upper respiratory type of symptoms.  No sick contacts.  She has a daughter that smokes, but she has not smoked for quite some  time although used to in the past.   PAST MEDICAL HISTORY:  Significant for:  1. Asthma.  2. Chronic back pain.  3. Hypertension.   REVIEW OF SYSTEMS:  Negative except for as in HPI.   SOCIAL HISTORY:  The patient used to be a smoker but currently quit.  Does not use drugs.  Does not drink alcohol.   FAMILY HISTORY:  Noncontributory.   ALLERGIES:  No known drug allergies.   MEDICATIONS:  1. Darvocet as needed.  2. ProAir as needed.  3. Advair 100/50 twice a day.  The patient has  not been using this as      prescribed.  4. Felodipine 5 mg daily.   VITAL SIGNS:  Temperature 97.6.  Blood pressure initially was 205/107,  now down to 168/72.  Pulse initially 98, now down to 82.  Respirations  18.  Saturating 99% to 96% on 2 liters.   PHYSICAL EXAMINATION:  The patient appears to be a slightly obese female  in no acute distress.  HEAD:  Nontraumatic.  Moist mucous membranes.  LUNGS:  There is expiratory wheezes late in expiration throughout but  best heard over trachea actually.  HEART:  Regular rate and rhythm.  No murmurs appreciated.  ABDOMEN:  Soft, nontender, nondistended, slightly obese.  LOWER EXTREMITIES:  Without clubbing, cyanosis or edema.  NEUROLOGIC:  Intact.   LABORATORY DATA:  White blood cell count 12.8.  Sodium 137, potassium  3.5, creatinine 1.2.  ABG 7.3, 93/38.7/94.2.  D-dimer negative.  Cardiac  markers unremarkable.  Chest x-ray showing low  lung volume and bibasilar  atelectasis, otherwise unremarkable.   ASSESSMENT/PLAN:  This is a 55 year old female with history of asthma  who presents with asthma exacerbation but also known history of tobacco  abuse..   1. Asthma/questionable chronic obstructive pulmonary disease      exacerbation.  Will put in step-down and give frequent albuterol      nebulizers.  Treat with high-dose steroids as the patient is      asthmatic IV.  Will increase dose of Advair and make sure she is      using it.  Will have pulmonary see patient in a.m.  They have been      already called.  We will give her magnesium IV and treat as a      potential chronic obstructive pulmonary disease exacerbation as      well as the patient has history of smoking.  Make sure she is on      Atrovent and give Augmentin.  2. History of chronic back pain.  Continue Percocet.  3. Hypertension.  Continue felodipine and hydralazine p.r.n. severe      hypertension.  4. Prophylaxis.  Protonix and Lovenox.   Dr. Rene Velasquez to assume  care in a.m.      Sheena Cowboy, MD  Electronically Signed     AVD/MEDQ  D:  07/06/2008  T:  07/06/2008  Job:  [id number]   cc:   Sheena Mole. Swords, MD  [address] [place]  [place] [address]

## 2010-10-19 NOTE — Procedures (Signed)
Sheena Velasquez, Sheena Velasquez             ACCOUNT NO.:  [id number]   MEDICAL RECORD NO.:  [id number]          PATIENT TYPE:  OUT   LOCATION:  SLEEP CENTER                 FACILITY:  [facility]   PHYSICIAN:  Clinton D. Maple Hudson, M.D. DATE OF BIRTH:  01/13/1956   DATE OF STUDY:  08/02/2004                              NOCTURNAL POLYSOMNOGRAM   REFERRING PHYSICIAN:  Hermelinda Medicus, MD   INDICATIONS FOR STUDY:  Hypersomnia with sleep apnea. Epworth sleepiness  score 12/24, BMI 43, weight 230 pounds.   SLEEP ARCHITECTURE:  Total sleep time 412 minutes with sleep efficiency of  80%. Stage I was 11%, stage II was 76%, stages III and IV were absent, REM  was 13% of total sleep time. Latency to sleep onset 26 minutes. Latency to  REM 292 minutes. Awake after sleep onset 79 minutes. Arousal index 28. No  medications were taken.   RESPIRATORY DATA:  NPSG protocol. Respiratory disturbance index (RDI) 75.5  obstructive events per hour indicating severe obstructive sleep  apnea/hypopnea syndrome.  This included 21 obstructive apneas and 498  hypopneas. The events were not positional. REM RDI 102.   OXYGEN DATA:  Moderate snoring with oxygen desaturation to a nadir of 66%.  Mean oxygen saturation on room air was 89% to 90%. The patient complained of  nasal congestion which was improved some when the technician gave her a  Breath Rite strip.   CARDIAC DATA:  Sinus rhythm with occasional PACs and sinus pauses.   MOVEMENT/PARASOMNIA:  Occasional leg jerk without effect on sleep.   IMPRESSION/RECOMMENDATIONS:  1.  Severe obstructive sleep apnea/hypopnea syndrome, RDI 75.5 per hour with      oxygen desaturation to 66%.  2.  Nasal congestion partially relieved with Breathe Rite strip.  3.  Consider return for CPAP titration or evaluate for alternative therapies      as appropriate.      CDY/MEDQ  D:  08/05/2004 12:55:01  T:  08/05/2004 20:34:44  Job:  [id number]

## 2010-10-19 NOTE — H&P (Signed)
NAMESHRADDHA, LEBRON             ACCOUNT NO.:  [id number]   MEDICAL RECORD NO.:  [id number]          PATIENT TYPE:  INP   LOCATION:  0104                         FACILITY:  [facility]   PHYSICIAN:  Titus Dubin. Alwyn Ren, M.D. [facility] OF BIRTH:  1956/01/26   DATE OF ADMISSION:  12/15/2005  DATE OF DISCHARGE:                                HISTORY & PHYSICAL   Sheena Velasquez is a 55 year old African-American female admitted with  bronchitis and uncontrolled accelerated hypertension.   She stated that she began to have a tickle in her throat associated with a  dry cough on July 11.  She noted some shortness of breath with definite  worsening in the evening of July 14.  She was using Robitussin but denies  the ingestion of any decongestants, such as pseudoephedrine-containing  products.  Because of the shortness of breath and some productive cough, she  came to the emergency room, where she was found to have a blood pressure of  234/126.  Nitroglycerin drip was initiated with decrease in the blood  pressure of 200/113.  Because of the uncontrolled hypertension, admission  was requested by Dr. Devoria Albe, emergency room physician.   Her past medical history is negative for any asthma or asthmatic bronchitic  phenomenon.  She is a nonsmoker.   Family history is also negative for pulmonary disease.   Her past medical history reveals that she has had back surgery, total  abdominal hysterectomy, and tonsillectomy.  She has a medical history of  esophageal reflux and is on Nexium twice daily, hypertension, and chronic  back pain.  She has been on Vicodin from Dr. Ethelene Hal for the back symptoms.   She denies any drug allergies.   She does not drink.   FAMILY HISTORY:  Positive for coronary artery disease and diabetes.   She denies any edema of the eyelids or oral structures.  She has been on  lisinopril and felodipine for hypertension.   She has had no ocular symptoms such as blurred vision.   She denies epistaxis  or headache.  She has had no purulence from the nares and denies otic pain.  She has had no sore throat.  She denies chest pain but has had some  tightness.  She denies any palpitations.  She has chronic dyspepsia for  which she is on Nexium twice daily.  She has had no rectal bleeding or  melena.  She denies any change in her urinary symptoms.  Despite the back  pain, she has had no paresthesias or radicular pain.  She denies any pain or  swelling in the calves.  There are no significant neuropsychiatric  historical items.   PHYSICAL EXAMINATION:  VITAL SIGNS:  She appears to have metabolic syndrome  clinically.  Weight is 239.8 pounds.  Pulse is 80 with some respiratory  variation.  Temperature is 97.  Respiratory rate 18-20.  Blood pressure at  the time of exam dropped to 161/76.  HEENT:  Arteriolar narrowing is present with some copper wiring changes.  There is no papilledema noted.  Nares are patent but dry.  Otic canals are  clear.  Tympanic membranes are normal.  Oropharynx reveals some posterior  edema but no definite angioedema or airway obstruction.  She is markedly  hoarse.  NECK:  She has no lymphadenopathy about the head, neck, or axilla.  Thyroid  is normal to palpation.  CHEST:  Clear without wheezing.  HEART:  Heart sounds are distant with an S4.  ABDOMEN:  Protuberant and nontender.  She has no organomegaly.  EXTREMITIES:  Denna Haggard' sign is negative.  She has no edema.  MUSCULOSKELETAL:  There is an operative scar over the lumbosacral area.  Straight leg raise is negative.  NEUROLOGIC:  There are no localizing neuropsychiatric symptoms.  She moves  all extremities and climbs off and onto the gurney without difficulty.   White count was 8100, hemoglobin and hematocrit were 13.2 and 40.8.  Cardiac  enzymes were negative x1.   EKG reveals nonspecific ST-T wave changes diffusely, particularly inferiorly  and laterally.  The changes appear to be  somewhat accentuated, compared to  the March 7th EKG in leads I, III, and aVF.  These changes are subtle and  nondiagnostic.   Chest x-ray reveals chronic mild bronchitic changes and remote granulomatous  changes.   She will be admitted to the unit for blood pressure control with parenteral  nitroglycerin and labetalol orally.  Felodipine will be continued.  Because  of the hoarseness and the cough, the ACE inhibitor will be held, and  angiotensin receptor blockers will be avoided.  Of concern is the  possibility she may be having an adverse effect related to the ACE  inhibitor.  She will be monitored for bronchospasm while on the labetalol.  As noted, she has no history of reactive airway disease and is a nonsmoker.   She will receive a pulmonary toilet and IV Solu-Medrol as well as topically  inhaled steroids.  Sliding-scale insulin will be initiated, as dictated by  t.i.d. AC glucoses.  Lovenox prophylaxis will also be initiated.      Titus Dubin. Alwyn Ren, M.D. [facility]  Electronically Signed     WFH/MEDQ  D:  12/15/2005  T:  12/15/2005  Job:  [id number]   cc:   Valetta Mole. Swords, M.D. [facility]  [address] [place]  [place] [address]   Caralyn Guile. Ethelene Hal, M.D.  Fax: [phone]

## 2010-10-19 NOTE — H&P (Signed)
NAMETEREZA, GILHAM             ACCOUNT NO.:  [id number]   MEDICAL RECORD NO.:  [id number]          PATIENT TYPE:  OBV   LOCATION:  0102                         FACILITY:  [facility]   PHYSICIAN:  Valetta Mole. Swords, M.D. LHCDATE OF BIRTH:  [birth year]   DATE OF PROCEDURE:  DATE OF DISCHARGE:                      STAT - MUST CHANGE TO CORRECT WORK TYPE   CHIEF COMPLAINT:  Headache.   HISTORY OF PRESENT ILLNESS:  Sheena Velasquez is a 55 year old female with a  history of hypertension.  She has been non-compliant recently with  medication and has had difficulty having her medications filled.  She came  to the emergency department today for high blood pressure and noted to have  an extremely high blood pressure.  She came to the emergency department  today for headache.  Noted to have an extremely high blood pressure and has  been treated in the emergency department.  During treatment protocols, the  patient developed chest discomfort.  She describes the chest discomfort as  substernal in nature, radiating to her left breast and around  her back.  She denies any shortness of breath associated with the symptoms.  No nausea  or vomiting.  She has never had this type of chest discomfort before.  Her  risk factors for heart disease include hypertension, family history, and  possibly new onset diabetes.   PAST MEDICAL HISTORY:  1.  Hypertension.  2.  Gastroesophageal reflux disease.  3.  Insomnia.  4.  She has had two back surgeries.  5.  She had a tonsillectomy last year.  6.  Hysterectomy.   MEDICATIONS:  1.  Nexium 40 mg p.o. daily.  2.  Temazepam unknown dose.  3.  Darvocet and Flexeril for back pain.   ALLERGIES:  No known drug allergies.   SOCIAL HISTORY:  She lives with her daughter. She works at the [employer].  She is a non-smoker. Drinks occasional alcohol.   FAMILY HISTORY:  Mother deceased with heart disease.  Father has valvular  heart disease as well as coronary disease  and significant psoriasis.   REVIEW OF SYSTEMS:  She denies any shortness of breath, PND, or orthopnea.  No neurologic deficit, visual deficit, lower extremity edema or any other  complaints in her review of systems.   PHYSICAL EXAMINATION:  VITAL SIGNS: Temperature 98.6.  Blood pressure has  ranged from 160-210/100-120.  GENERAL:  She appears as a morbidly obese female in no acute distress.  HEENT:  Atraumatic, normocephalic. Extraocular muscles were intact.  NECK:  Supple without lymphadenopathy, thyromegaly, jugular venous  distension, or carotid bruits.  CHEST:  Clear to auscultation without any increased work of breathing.  CARDIAC:  S1 and S2 are normal without murmurs.  She does have an S4 gallop.  ABDOMEN:  Active bowel sounds, soft, nontender.  There is no  hepatosplenomegaly. No masses are palpated.  EXTREMITIES:  No clubbing, cyanosis or edema.  NEUROLOGIC:  She is alert and oriented without any motor or sensory  deficits.   LABORATORY DATA:  EKG demonstrates normal sinus rhythm with ST-T wave  changes. She states that she has a chronically abnormal  EKG.   Sodium 168, potassium 3.4.   ASSESSMENT/PLAN:  1.  Hypertensive urgency presenting with headache.  Blood pressure is better      controlled now in the emergency department.  Will start the patient on      lisinopril 20 mg, hydrochlorothiazide 25 mg and labetalol 200 mg b.i.d.      She will be admitted to the telemetry unit.  2.  In regards to her chest discomfort, we will start Lovenox.  I suspect      she will rule out for a myocardial infarction and she will need an      outpatient Cardiolite.  3.  Hyperglycemia.  Needs to be followed up.  Will check an hemoglobin A1C.  4.  Hypokalemia.  Unknown cause, but likely related to hypertension.  Will      replace potassium.      Bruce Rexene Edison Swords, M.D. [facility]  Electronically Signed     BHS/MEDQ  D:  08/08/2005  T:  08/08/2005  Job:  [id number]

## 2010-10-19 NOTE — Discharge Summary (Signed)
Sheena Velasquez, Sheena Velasquez             ACCOUNT NO.:  [id number]   MEDICAL RECORD NO.:  [id number]          PATIENT TYPE:  INP   LOCATION:  2023                         FACILITY:  [facility]   PHYSICIAN:  Rene Paci, M.D. LHCDATE OF BIRTH:  1956-03-10   DATE OF ADMISSION:  12/15/2005  DATE OF DISCHARGE:  12/19/2005                                 DISCHARGE SUMMARY   DISCHARGE DIAGNOSES:  1.  Acute bronchitis, improved.  Continue aggressive cough and reflux      suppression status post 5 days azithromycin treatment.  2.  Accelerated hypertension, improved with medication changes.  See below.  3.  Hyperglycemia exacerbated by steroids.  Probable metabolic syndrome      tendency versus early diabetes.  Hemoglobin A1c in March 2007 is 6.2.      Recommend low carb diet and close outpatient followup.   DISCHARGE MEDICATIONS:  Includes:  1.  Discontinuation at this time of lisinopril.  2.  Plendil 10 mg once daily.  3.  Labetalol 300 mg b.i.d.  4.  Hydrochlorothiazide 12.5 mg once daily.  5.  Prednisone 20 mg tablets with taper as described-60 mg x2 days and 40 mg      x3 days, and 20 mg x3 days, and 10 mg x2 days then stop.  6.  Tussionex Cough Syrup 1 teaspoon b.i.d. p.r.n cough suppression.  7.  Nexium 40 mg 1 b.i.d. as prior to admission.   FOLLOWUP:  Hospital followup will be with primary care physician Dr. Birdie Sons to be arranged by patient next week for followup on blood pressure  control, cough suppression, and lymphadenopathy resolution.  The patient has  opted to arrange this appointment on her own.   CONDITION ON DISCHARGE:  Medically improved.  Hemodynamically and oxygen  saturation stable.  CBGs ranging 90-150 day prior to discharge.   HOSPITAL COURSE:  1.  Acute bronchitis.  Patient is a 55 year old woman who presented to the      emergency room due to nonproductive cough for 4 days prior to admission      with worsening breathing despite outpatient treatment with  Robitussin.      On evaluation in the emergency room at [facility] she was also found to have      a systolic pressure of 234/126, for which nitroglycerin treatment was      initiated and a call for admission and treatment was requested by EDP.      She was admitted and transferred to [facility] due to lack of      beds at [facility].  She was treated empirically with IV Solu-Medrol      nebulized treatment as well as empiric azithromycin x5 days.  Chest x-      ray did not show any evidence of infiltration or failure.  Patient was      also treated aggressively with b.i.d. proton pump inhibitor for reflux      management and Tussionex Cough Suppression twice daily.  2.  Regarding her accelerated hypertension, the nitroglycerin drip was      quickly weaned and her lisinopril  treatment prior to admission was put      on hold due to possible exacerbation of her cough.  She was treated      instead with labetalol and hydrochlorothiazide in addition to her home      Plendil which provided good blood control over the 4 to 5 days of      hospitalizations.  Blood pressure on day of discharge is 135/68 with a      pulse of 73.  Continue these medications with outpatient followup and      primary MD.  3.  Lastly, patient had a tendency towards hyperglycemia on steroid      treatment.  Review of the record show that a previous A1c had been      checked for mild hyperglycemia back in March 2007 which was borderline      elevated at that time.  Metabolic syndrome tendency as well as early      diabetes risks were reviewed with  patient.  She was treated with      sliding scale during this hospitalization and a steroid taper is      underway, which should help with CBG control.  I have educated patient      on a carb modified diet and encouraged her to have close followup for      monitoring of diabetes as an outpatient.      Rene Paci, M.D. [facility]  Electronically Signed     VL/MEDQ   D:  12/19/2005  T:  12/19/2005  Job:  [id number]   cc:   Valetta Mole. Swords, M.D. [facility]  [address] [place]  [place] [address]

## 2010-10-19 NOTE — H&P (Signed)
Sheena Velasquez, Sheena Velasquez             ACCOUNT NO.:  [id number]   MEDICAL RECORD NO.:  [id number]          PATIENT TYPE:  OIB   LOCATION:  2550                         FACILITY:  [facility]   PHYSICIAN:  Hermelinda Medicus, M.D.   DATE OF BIRTH:  03/08/1956   DATE OF ADMISSION:  09/21/2004  DATE OF DISCHARGE:                                HISTORY & PHYSICAL   HISTORY OF PRESENT ILLNESS:  This patient is a 55 year old female who has  sleep apnea that is quite extensive.  She has a RDI of 75.  Her O2 nadir was  66%.  She is 5 feet 2 inches an weighs 233.  She has been chronically  fatigued, also has considerable gastroesophageal reflux associated this.  She has a septal deviation. A turbinate hypertrophy with a very small mouth  with quite large tonsils and now enters for a uvula palatoplasty,  tonsillectomy, septal reconstruction, turbinate reduction.   PAST MEDICAL HISTORY:  Her past history is remarkable for the fact she has  had a lumbar laminectomy and a hysterectomy.   ALLERGIES:  She does not have any allergies to medications.   LABORATORY DATA:  She has normal EKG.  Hemoglobin, hematocrit is 12.8 and 39.   CURRENT MEDICATIONS:  Arthrotec 75 mg b.i.d., Flexeril 10 mg b.i.d., Allegra  daily 180, Nexium b.i.d. 40 mg, Darvocet-N 100 p.r.n. and Tylenol p.r.n.   PHYSICAL EXAMINATION:  Blood pressure of 168/87.  Her weight is 239 at this  point and she is 5 feet 2 inches.  HEENT:  Her ears are clear, tympanic membranes are clear, oral cavity showed  very large tonsils with a very small oral cavity and a low uvula and palatal  contour.  NECK:  Full, pushing her tongue high into her mouth and neck is free of any  thyromegaly, cervical adenopathy or mass.  NOSE:  Shows a septal deviation to the left and she also has allergic  rhinitis, giving her some turbinate edema, and she also has turbinate  hypertrophy.  The larynx is clear, true cords, false cords, epiglottis, base of tongue are  clear,  _________, gag reflex __________ EOMs, facial nerve are all  symmetrical.  CHEST:  There re no rales, rhonchi or wheezes.  CARDIOVASCULAR:  No rubs, murmurs or gallops.  EXTREMITIES:  Unremarkable.  ABDOMEN:  Obese.   INITIAL DIAGNOSES:  Sleep apnea, RDI of 75, lowest O2 of 66, with  gastroesophageal reflux, history of hysterectomy, history of lumbar  laminectomy.      JC/MEDQ  D:  09/21/2004  T:  09/21/2004  Job:  [id number]   cc:   Valetta Mole. Swords, M.D. [facility]

## 2010-10-19 NOTE — Op Note (Signed)
Sheena Velasquez, Sheena Velasquez             ACCOUNT NO.:  [id number]   MEDICAL RECORD NO.:  [id number]          PATIENT TYPE:  OIB   LOCATION:  2550                         FACILITY:  [facility]   PHYSICIAN:  Hermelinda Medicus, M.D.   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   OPERATION:  Septal reconstruction and turbinate reduction, tonsillectomy,  uvulopalatoplasty.   ANESTHESIA:  General.   ANESTHESIOLOGIST:  Germaine Pomfret, M.D.   SURGEON:  Hermelinda Medicus, M.D.   BRIEF HISTORY:  The patient is aware of the risks and gains, that  tonsillectomy can cause postoperative bleeding.  She is also aware that she  cannot travel for ten days at least and she will be out of work for  approximately five days and she will have a considerably sore throat and  that she probably will need allergy evaluation and treatment after this as  she has quite edematous turbinates as well as enlarged turbinates and a  septal deviation. Her sleep apnea or polysomnogram evaluation showed an RDI  of 75.5 and an O2 nadir of 66%.  She also has the associated  gastroesophageal reflux which has to be carefully monitored and will be,  hopefully, improved once the sleep apnea has improved.   PROCEDURE:  The patient was placed in the supine position.  Under general  endotracheal anesthesia, the septum was first approached.  Topical cocaine  200 mg and 1% Xylocaine with epinephrine was also used to shrink the  membranes as much as possible and the turbinates, which were grossly  enlarged in the inferior aspect were outfractured aggressively using the  butter knife and we were able to gain considerable  space by doing this.  The  septum was deviated, also, to the left which gave her considerable blockage  especially on the left side with some, also, on the right, and this was  corrected by using a hemitransfixion in on the right side, carried around  the columella, to the left.  The whole septum was off the premaxillary crest  and a strip of quadrilateral cartilage was taken from the floor of the nose  and then along from the posterior quadrilateral cartilage, also.  The  ethmoid septal deviation was also approached using the open and close Laren Boom and then the 4 mm chisel was used to take down the vomer spur that  was abutting into the left side of her nose.  Once the septum was brought  completely to the midline, the closure was begun using 5-0 plain catgut.  Once this was completed, 4-0 plain x 2 with a through and through septal  blanket suture was used to minimize swelling and to minimize any chance of  bleeding from this.  Telfa packing was placed.   The oral cavity was then approached and the tongue was  found to be quite  large.  The gag was placed and the tonsils were removed using blunt and  hemostatic Bovie electrocoagulation.  Once this was corrected, we were able  to gain considerable more space.  The very large, oversized uvula was  trimmed to about half the size bringing it back to its normal size, and the  palate was trimmed, also, to raise the contour of the palate.  All  hemostasis was established.  The stomach was suctioned.  The nasopharynx was  clear.  We then removed the tonsillar gag carefully and slowly to make sure  the tonsillar beds showed no evidence of bleeding and then we went back to  the nose where we took the Telfa from the nose and used the bipolar cautery  set at 12 watts to cauterize the inferior lateral aspect of the turbinates  in an effort to minimize any bulkiness of this mucous membrane.  Once this  was completed, then we placed #7  anesthesia trumpets within the nasal  vestibule to guarantee her airway postoperatively and the patient was  awakened and tolerated the procedure well and had an excellent airway  postop.  The patient is doing well, will be observed in 3300 or a step down  unit for close observation and pulse oximetry overnight.  The patient will  follow up in five days, two weeks, four weeks, six weeks, three months, six  months, and a year.      JC/MEDQ  D:  09/21/2004  T:  09/21/2004  Job:  [id number]   cc:   Valetta Mole. Swords, M.D. [facility]

## 2010-11-01 ENCOUNTER — Other Ambulatory Visit: Payer: Self-pay | Admitting: Internal Medicine

## 2010-11-15 ENCOUNTER — Other Ambulatory Visit: Payer: Self-pay | Admitting: Internal Medicine

## 2011-01-01 ENCOUNTER — Encounter: Payer: Self-pay | Admitting: Internal Medicine

## 2011-01-04 ENCOUNTER — Ambulatory Visit: Payer: Self-pay | Admitting: Internal Medicine

## 2011-01-07 ENCOUNTER — Encounter: Payer: Self-pay | Admitting: Internal Medicine

## 2011-01-07 ENCOUNTER — Ambulatory Visit (INDEPENDENT_AMBULATORY_CARE_PROVIDER_SITE_OTHER): Payer: Federal, State, Local not specified - PPO | Admitting: Internal Medicine

## 2011-01-07 VITALS — BP 166/104 | HR 80 | Temp 97.8°F | Ht 62.0 in | Wt 217.0 lb

## 2011-01-07 DIAGNOSIS — I10 Essential (primary) hypertension: Secondary | ICD-10-CM

## 2011-01-07 DIAGNOSIS — R5383 Other fatigue: Secondary | ICD-10-CM | POA: Insufficient documentation

## 2011-01-07 DIAGNOSIS — R5381 Other malaise: Secondary | ICD-10-CM

## 2011-01-07 LAB — HEPATIC FUNCTION PANEL
ALT: 18 U/L (ref 0–35)
AST: 16 U/L (ref 0–37)
Alkaline Phosphatase: 63 U/L (ref 39–117)
Total Bilirubin: 1 mg/dL (ref 0.3–1.2)

## 2011-01-07 LAB — BASIC METABOLIC PANEL
BUN: 19 mg/dL (ref 6–23)
Chloride: 101 mEq/L (ref 96–112)
GFR: 72.31 mL/min (ref >60.00)
Glucose, Bld: 104 mg/dL — ABNORMAL HIGH (ref 70–99)
Potassium: 3.7 mEq/L (ref 3.5–5.1)
Sodium: 138 mEq/L (ref 135–145)

## 2011-01-07 LAB — CBC WITH DIFFERENTIAL/PLATELET
Basophils Absolute: 0 10*3/uL (ref 0.0–0.1)
Basophils Relative: 0.3 % (ref 0.0–3.0)
Eosinophils Absolute: 0.2 10*3/uL (ref 0.0–0.7)
Hemoglobin: 13.3 g/dL (ref 12.0–15.0)
Lymphs Abs: 2.3 10*3/uL (ref 0.7–4.0)
MCHC: 32.5 g/dL (ref 30.0–36.0)
MCV: 82.8 fl (ref 78.0–100.0)
Monocytes Absolute: 0.6 10*3/uL (ref 0.1–1.0)
Neutro Abs: 4.3 10*3/uL (ref 1.4–7.7)
RBC: 4.92 Mil/uL (ref 3.87–5.11)
RDW: 14.5 % (ref 11.5–14.6)

## 2011-01-07 MED ORDER — LABETALOL HCL 200 MG PO TABS: 200.0000 mg | ORAL_TABLET | Freq: Two times a day (BID) | ORAL | Status: DC

## 2011-01-07 NOTE — Assessment & Plan Note (Addendum)
Lab Results  Component Value Date   NA 141 01/07/2009   K 3.6 01/07/2009   CL 106 01/07/2009   CO2 29 01/07/2009   BUN 10 01/07/2009   CREATININE 0.92 01/07/2009    BP Readings from Last 3 Encounters:  01/07/11 166/104  02/10/09 118/90  01/11/09 184/112    Assessment: Hypertension control:  severely elevated  Progress toward goals:  deteriorated Barriers to meeting goals:  no barriers identified  Plan: Hypertension treatment:  continue current medications and add labetalol. Side effects discussed
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
## 2011-01-07 NOTE — Assessment & Plan Note (Signed)
Needs further evaluation Check labs today 

## 2011-02-25 ENCOUNTER — Telehealth: Payer: Self-pay | Admitting: Family Medicine

## 2011-02-25 ENCOUNTER — Other Ambulatory Visit: Payer: Self-pay | Admitting: Internal Medicine

## 2011-02-25 ENCOUNTER — Ambulatory Visit (INDEPENDENT_AMBULATORY_CARE_PROVIDER_SITE_OTHER): Payer: Federal, State, Local not specified - PPO | Admitting: Internal Medicine

## 2011-02-25 ENCOUNTER — Encounter: Payer: Self-pay | Admitting: Internal Medicine

## 2011-02-25 VITALS — BP 166/100 | HR 78 | Temp 98.3°F | Ht 62.0 in | Wt 210.0 lb

## 2011-02-25 DIAGNOSIS — I10 Essential (primary) hypertension: Secondary | ICD-10-CM

## 2011-02-25 DIAGNOSIS — Z23 Encounter for immunization: Secondary | ICD-10-CM

## 2011-02-25 DIAGNOSIS — Z0000 Encounter for general adult medical examination without abnormal findings: Secondary | ICD-10-CM

## 2011-02-25 DIAGNOSIS — Z1231 Encounter for screening mammogram for malignant neoplasm of breast: Secondary | ICD-10-CM

## 2011-02-25 MED ORDER — LISINOPRIL 40 MG PO TABS: 40.0000 mg | ORAL_TABLET | Freq: Every day | ORAL | Status: DC

## 2011-02-25 MED ORDER — FELODIPINE ER 10 MG PO TB24: 10.0000 mg | ORAL_TABLET | Freq: Every day | ORAL | Status: DC

## 2011-02-25 MED ORDER — HYDROCHLOROTHIAZIDE 25 MG PO TABS: 25.0000 mg | ORAL_TABLET | Freq: Every day | ORAL | Status: DC

## 2011-02-25 NOTE — Assessment & Plan Note (Signed)
She is to resume meds at higher doses---see list Side effects discussed
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
## 2011-02-25 NOTE — Telephone Encounter (Signed)
Spoke with breast ctr. They will be calling the pt to set up her mammogram.

## 2011-03-04 ENCOUNTER — Ambulatory Visit
Admission: RE | Admit: 2011-03-04 | Discharge: 2011-03-04 | Disposition: A | Discharge status: Home / Self Care | Payer: Federal, State, Local not specified - PPO | Source: Ambulatory Visit | Attending: Internal Medicine | Admitting: Internal Medicine

## 2011-03-04 DIAGNOSIS — Z1231 Encounter for screening mammogram for malignant neoplasm of breast: Secondary | ICD-10-CM

## 2011-03-04 LAB — CBC
MCHC: 33.2
Platelets: 262
RBC: 4.97
RDW: 13.7
WBC: 16.1 — ABNORMAL HIGH

## 2011-03-04 LAB — COMPREHENSIVE METABOLIC PANEL
ALT: 17
ALT: 17
AST: 22
Albumin: 4.7
Alkaline Phosphatase: 69
Calcium: 9.7
GFR calc Af Amer: >60
GFR calc Af Amer: >60
Glucose, Bld: 109 — ABNORMAL HIGH
Glucose, Bld: 132 — ABNORMAL HIGH
Potassium: 3.9
Potassium: 4
Sodium: 141
Sodium: 146 — ABNORMAL HIGH
Total Protein: 7.2
Total Protein: 8.3

## 2011-03-04 LAB — DIFFERENTIAL
Basophils Relative: 0
Eosinophils Relative: 0
Lymphs Abs: 1.4
Monocytes Relative: 4

## 2011-03-04 LAB — URINALYSIS, ROUTINE W REFLEX MICROSCOPIC
Bilirubin Urine: NEGATIVE
Glucose, UA: NEGATIVE
Nitrite: NEGATIVE
Specific Gravity, Urine: 1.004 — ABNORMAL LOW
pH: 7

## 2011-03-04 LAB — HEMOGLOBIN A1C: Hgb A1c MFr Bld: 6

## 2011-03-04 LAB — B-NATRIURETIC PEPTIDE (CONVERTED LAB): Pro B Natriuretic peptide (BNP): <30

## 2011-03-11 ENCOUNTER — Other Ambulatory Visit: Payer: Self-pay | Admitting: Internal Medicine

## 2011-03-11 DIAGNOSIS — R928 Other abnormal and inconclusive findings on diagnostic imaging of breast: Secondary | ICD-10-CM

## 2011-03-13 ENCOUNTER — Encounter: Payer: Self-pay | Admitting: Gastroenterology

## 2011-03-13 ENCOUNTER — Ambulatory Visit (AMBULATORY_SURGERY_CENTER): Payer: Federal, State, Local not specified - PPO | Admitting: *Deleted

## 2011-03-13 VITALS — Ht 62.0 in | Wt 215.6 lb

## 2011-03-13 DIAGNOSIS — Z1211 Encounter for screening for malignant neoplasm of colon: Secondary | ICD-10-CM

## 2011-03-13 MED ORDER — MOVIPREP 100 G PO SOLR: ORAL | Status: DC

## 2011-03-20 LAB — DIFFERENTIAL
Eosinophils Absolute: 0.2
Eosinophils Relative: 2
Lymphocytes Relative: 29
Lymphs Abs: 2.8
Monocytes Relative: 9

## 2011-03-20 LAB — URINALYSIS, ROUTINE W REFLEX MICROSCOPIC
Glucose, UA: NEGATIVE
Hgb urine dipstick: NEGATIVE
Ketones, ur: NEGATIVE
Protein, ur: NEGATIVE
pH: 6

## 2011-03-20 LAB — POCT CARDIAC MARKERS
CKMB, poc: 2.2
Operator id: 4661
Troponin i, poc: <0.05

## 2011-03-20 LAB — D-DIMER, QUANTITATIVE: D-Dimer, Quant: <0.22

## 2011-03-20 LAB — BASIC METABOLIC PANEL
Chloride: 110
GFR calc Af Amer: >60
GFR calc non Af Amer: >60
Potassium: 4.2
Sodium: 143

## 2011-03-20 LAB — CBC
HCT: 44.1
Hemoglobin: 14.4
MCV: 80.4
RBC: 5.48 — ABNORMAL HIGH
WBC: 9.7

## 2011-03-20 LAB — URINE MICROSCOPIC-ADD ON

## 2011-03-22 ENCOUNTER — Ambulatory Visit
Admission: RE | Admit: 2011-03-22 | Discharge: 2011-03-22 | Disposition: A | Discharge status: Home / Self Care | Payer: Federal, State, Local not specified - PPO | Source: Ambulatory Visit | Attending: Internal Medicine | Admitting: Internal Medicine

## 2011-03-22 DIAGNOSIS — R928 Other abnormal and inconclusive findings on diagnostic imaging of breast: Secondary | ICD-10-CM

## 2011-03-27 ENCOUNTER — Ambulatory Visit (AMBULATORY_SURGERY_CENTER): Payer: Federal, State, Local not specified - PPO | Admitting: Gastroenterology

## 2011-03-27 ENCOUNTER — Other Ambulatory Visit: Payer: Federal, State, Local not specified - PPO | Admitting: Internal Medicine

## 2011-03-27 ENCOUNTER — Encounter: Payer: Self-pay | Admitting: Gastroenterology

## 2011-03-27 VITALS — BP 149/84 | HR 75 | Temp 97.4°F | Resp 14 | Ht 62.0 in | Wt 215.0 lb

## 2011-03-27 DIAGNOSIS — Z1211 Encounter for screening for malignant neoplasm of colon: Secondary | ICD-10-CM

## 2011-03-27 DIAGNOSIS — D126 Benign neoplasm of colon, unspecified: Secondary | ICD-10-CM

## 2011-03-27 MED ORDER — SODIUM CHLORIDE 0.9 % IV SOLN: 500.0000 mL | INTRAVENOUS | Status: DC

## 2011-03-27 NOTE — Patient Instructions (Signed)
FOLLOW THE DISCHARGE INSTRUCTIONS ON THE GREEN AND BLUE INSTRUCTION SHEETS.  CONTINUE YOUR MEDICATIONS. AWAIT PATHOLOGY RESULTS.

## 2011-03-27 NOTE — Progress Notes (Signed)
Pt had cramping with the scope advancement.  Medications were titrated per Dr. Ardell Isaacs orders.  Pt did relax and went to sleep and snoring was noted when the scope was being withdrawn from the cecum.  The pt rested comfortably with her eyes closed. maw

## 2011-03-27 NOTE — Progress Notes (Signed)
Pt states pain is now an 8 on scale of 1-10.

## 2011-03-28 ENCOUNTER — Telehealth: Payer: Self-pay | Admitting: *Deleted

## 2011-03-28 NOTE — Telephone Encounter (Signed)
Follow up Call- Patient questions:  Do you have a fever, pain , or abdominal swelling? no Pain Score  0 *  Have you tolerated food without any problems? yes  Have you been able to return to your normal activities? yes  Do you have any questions about your discharge instructions: Diet   no Medications  no Follow up visit  no  Do you have questions or concerns about your Care? no  Actions: * If pain score is 4 or above: No action needed, pain <4. Pt at work per family member but family member states she did fine and had no problems after procedure yesterday

## 2011-03-29 ENCOUNTER — Ambulatory Visit (INDEPENDENT_AMBULATORY_CARE_PROVIDER_SITE_OTHER): Payer: Federal, State, Local not specified - PPO | Admitting: Internal Medicine

## 2011-03-29 ENCOUNTER — Encounter: Payer: Self-pay | Admitting: Internal Medicine

## 2011-03-29 DIAGNOSIS — I10 Essential (primary) hypertension: Secondary | ICD-10-CM

## 2011-03-29 NOTE — Assessment & Plan Note (Signed)
Adequate control Continue same meds 

## 2011-04-01 ENCOUNTER — Encounter: Payer: Self-pay | Admitting: Gastroenterology
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
## 2011-04-16 ENCOUNTER — Other Ambulatory Visit: Payer: Self-pay | Admitting: Internal Medicine

## 2011-09-19 ENCOUNTER — Ambulatory Visit: Payer: Federal, State, Local not specified - PPO | Admitting: Internal Medicine

## 2011-09-27 ENCOUNTER — Ambulatory Visit: Payer: Federal, State, Local not specified - PPO | Admitting: Internal Medicine

## 2011-11-21 ENCOUNTER — Other Ambulatory Visit: Payer: Self-pay | Admitting: Internal Medicine

## 2011-11-26 ENCOUNTER — Encounter: Payer: Self-pay | Admitting: Internal Medicine

## 2011-11-26 ENCOUNTER — Ambulatory Visit (INDEPENDENT_AMBULATORY_CARE_PROVIDER_SITE_OTHER): Payer: Federal, State, Local not specified - PPO | Admitting: Internal Medicine

## 2011-11-26 ENCOUNTER — Telehealth: Payer: Self-pay | Admitting: Internal Medicine

## 2011-11-26 VITALS — BP 132/90 | HR 77 | Temp 98.3°F | Wt 198.0 lb

## 2011-11-26 DIAGNOSIS — H6690 Otitis media, unspecified, unspecified ear: Secondary | ICD-10-CM

## 2011-11-26 MED ORDER — HYDROCODONE-HOMATROPINE 5-1.5 MG/5ML PO SYRP: 5.0000 mL | ORAL_SOLUTION | Freq: Three times a day (TID) | ORAL | Status: AC | PRN

## 2011-11-26 MED ORDER — ALBUTEROL SULFATE HFA 108 (90 BASE) MCG/ACT IN AERS: 2.0000 | INHALATION_SPRAY | RESPIRATORY_TRACT | Status: DC | PRN

## 2011-11-26 MED ORDER — CEFUROXIME AXETIL 500 MG PO TABS: 500.0000 mg | ORAL_TABLET | Freq: Two times a day (BID) | ORAL | Status: AC

## 2011-11-26 NOTE — Telephone Encounter (Signed)
Caller: Pam/Patient; PCP: Birdie Sons; CB#: [phone];  Call regarding Cough/Congestion; Onset approx 11/18/11, worse since 6/23.  Taking Delsym without relief.  Thick milky sputum reported.  Relates she has "pump" / inhaler that she uses prn.  Appt with Dr. Artist Pais at 1400 on 11/26/11 as none available with PCP.
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
## 2011-11-26 NOTE — Assessment & Plan Note (Signed)
56 year old [place] American female with signs and symptoms of otitis media and upper respiratory infection. Treat with cefuroxime 500 mg twice daily x10 days. Use Hycodan for cough.  Patient advised to call office if symptoms persist or worsen.

## 2011-11-26 NOTE — Patient Instructions (Addendum)
Drink plenty of fluids Use Mucinex DM during the day for cough Use prescription cough mainly in the evening  Please call our office if your symptoms do not improve or gets worse.

## 2011-12-09 ENCOUNTER — Ambulatory Visit (INDEPENDENT_AMBULATORY_CARE_PROVIDER_SITE_OTHER): Payer: Federal, State, Local not specified - PPO | Admitting: Family Medicine

## 2011-12-09 ENCOUNTER — Encounter: Payer: Self-pay | Admitting: Family Medicine

## 2011-12-09 ENCOUNTER — Telehealth: Payer: Self-pay | Admitting: Internal Medicine

## 2011-12-09 VITALS — BP 130/90 | HR 97 | Temp 99.1°F | Wt 200.0 lb

## 2011-12-09 DIAGNOSIS — J329 Chronic sinusitis, unspecified: Secondary | ICD-10-CM

## 2011-12-09 MED ORDER — DOXYCYCLINE HYCLATE 100 MG PO CAPS: 100.0000 mg | ORAL_CAPSULE | Freq: Two times a day (BID) | ORAL | Status: AC

## 2011-12-09 NOTE — Progress Notes (Signed)
  Subjective:    Patient ID: Sheena Velasquez, female    DOB: 02-24-1956, 56 y.o.   MRN: [id number]  HPI Here for 3 weeks of sinus pressure, ear pains, PND, ST, and a dry cough. She was seen on 11-26-11 and was prescribed Ceftin. However she developed hives all over the body after taking 3 doses of this so she stopped. The hives resolved. Her URI symptoms remain however. She is using Hycodan syrup at night for cough and using a Ventolin inhaler once or twice a day.    Review of Systems  Constitutional: Negative.   HENT: Positive for congestion, sore throat, postnasal drip and sinus pressure.   Eyes: Negative.   Respiratory: Positive for cough.        Objective:   Physical Exam  Constitutional: She appears well-developed and well-nourished.  HENT:  Right Ear: External ear normal.  Left Ear: External ear normal.  Nose: Nose normal.  Mouth/Throat: Oropharynx is clear and moist. No oropharyngeal exudate.  Eyes: Conjunctivae are normal.  Pulmonary/Chest: Effort normal and breath sounds normal.  Lymphadenopathy:    She has no cervical adenopathy.          Assessment & Plan:  Try Doxycycline. Recheck prn.

## 2011-12-09 NOTE — Telephone Encounter (Signed)
appt with Dr Clent Ridges

## 2011-12-09 NOTE — Telephone Encounter (Signed)
Caller: Helvi/Patient; PCP: Birdie Sons; CB#: [phone];  Call regarding Seen in Office on 11/26/11 and Dx With Ear Infection and She Still Has Congestion and Cough. Hives and nausea 3 days After Taking Cefuromime- So Stopped Taking It. She is very congested and throat is sore from cough. She is using Ventolin Inhaler prn, taking cough med and helps some but still having trouble sleeping and coughing up blood streaked mucos in the mornings. Afebrile. Triage per Cough Protocol and appnt advised within 24 hours for "blood streaked sputum". Scheduled today 12/09/11 @ 1545.

## 2012-01-14 ENCOUNTER — Emergency Department (HOSPITAL_COMMUNITY)
Admission: EM | Admit: 2012-01-14 | Discharge: 2012-01-14 | Disposition: A | Discharge status: Home / Self Care | Payer: Federal, State, Local not specified - PPO | Attending: Emergency Medicine | Admitting: Emergency Medicine

## 2012-01-14 ENCOUNTER — Emergency Department (HOSPITAL_COMMUNITY): Payer: Federal, State, Local not specified - PPO

## 2012-01-14 ENCOUNTER — Encounter (HOSPITAL_COMMUNITY): Payer: Self-pay | Admitting: Emergency Medicine

## 2012-01-14 DIAGNOSIS — M5431 Sciatica, right side: Secondary | ICD-10-CM

## 2012-01-14 DIAGNOSIS — K219 Gastro-esophageal reflux disease without esophagitis: Secondary | ICD-10-CM | POA: Insufficient documentation

## 2012-01-14 DIAGNOSIS — M5430 Sciatica, unspecified side: Secondary | ICD-10-CM | POA: Insufficient documentation

## 2012-01-14 DIAGNOSIS — Z87891 Personal history of nicotine dependence: Secondary | ICD-10-CM | POA: Insufficient documentation

## 2012-01-14 DIAGNOSIS — I10 Essential (primary) hypertension: Secondary | ICD-10-CM | POA: Insufficient documentation

## 2012-01-14 MED ORDER — HYDROMORPHONE HCL PF 1 MG/ML IJ SOLN
1.0000 mg | Freq: Once | INTRAMUSCULAR | Status: AC
  Administered 2012-01-14: 1 mg via INTRAVENOUS
  Filled 2012-01-14: qty 1

## 2012-01-14 MED ORDER — OXYCODONE-ACETAMINOPHEN 5-325 MG PO TABS: 1.0000 | ORAL_TABLET | Freq: Four times a day (QID) | ORAL | Status: AC | PRN

## 2012-01-14 MED ORDER — ONDANSETRON HCL 4 MG PO TABS: 4.0000 mg | ORAL_TABLET | Freq: Four times a day (QID) | ORAL | Status: AC

## 2012-01-14 MED ORDER — KETOROLAC TROMETHAMINE 30 MG/ML IJ SOLN
30.0000 mg | Freq: Once | INTRAMUSCULAR | Status: AC
  Administered 2012-01-14: 30 mg via INTRAVENOUS
  Filled 2012-01-14: qty 1

## 2012-01-14 MED ORDER — PREDNISONE 20 MG PO TABS: 60.0000 mg | ORAL_TABLET | Freq: Every day | ORAL | Status: DC

## 2012-01-14 MED ORDER — ONDANSETRON HCL 4 MG/2ML IJ SOLN
4.0000 mg | Freq: Once | INTRAMUSCULAR | Status: AC
  Administered 2012-01-14: 4 mg via INTRAVENOUS
  Filled 2012-01-14: qty 2

## 2012-01-14 NOTE — ED Notes (Signed)
Pain started Saturday, radiating from right lower back down leg, walking with cane today- hx back surgeries
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
## 2012-01-17 NOTE — ED Provider Notes (Signed)
Medical screening examination/treatment/procedure(s) were performed by non-physician practitioner and as supervising physician I was immediately available for consultation/collaboration.   Nannie Starzyk E John Vasconcelos, MD 01/17/12 0559 

## 2012-03-06 ENCOUNTER — Other Ambulatory Visit: Payer: Self-pay | Admitting: Internal Medicine

## 2012-04-13 ENCOUNTER — Other Ambulatory Visit: Payer: Self-pay | Admitting: Internal Medicine

## 2012-08-20 ENCOUNTER — Other Ambulatory Visit: Payer: Self-pay | Admitting: Internal Medicine

## 2012-08-31 ENCOUNTER — Other Ambulatory Visit: Payer: Self-pay | Admitting: Internal Medicine

## 2012-09-10 ENCOUNTER — Other Ambulatory Visit: Payer: Self-pay | Admitting: Internal Medicine

## 2012-11-04 ENCOUNTER — Ambulatory Visit: Payer: Federal, State, Local not specified - PPO | Admitting: Internal Medicine

## 2012-11-18 ENCOUNTER — Encounter: Payer: Self-pay | Admitting: Internal Medicine

## 2012-11-18 ENCOUNTER — Other Ambulatory Visit: Payer: Self-pay | Admitting: Internal Medicine

## 2012-11-18 ENCOUNTER — Ambulatory Visit (INDEPENDENT_AMBULATORY_CARE_PROVIDER_SITE_OTHER): Payer: Federal, State, Local not specified - PPO | Admitting: Internal Medicine

## 2012-11-18 VITALS — BP 130/80 | HR 68 | Temp 98.2°F | Wt 196.0 lb

## 2012-11-18 DIAGNOSIS — I10 Essential (primary) hypertension: Secondary | ICD-10-CM

## 2012-11-18 DIAGNOSIS — J45909 Unspecified asthma, uncomplicated: Secondary | ICD-10-CM

## 2012-11-18 DIAGNOSIS — K219 Gastro-esophageal reflux disease without esophagitis: Secondary | ICD-10-CM

## 2012-11-18 DIAGNOSIS — R5383 Other fatigue: Secondary | ICD-10-CM

## 2012-11-18 DIAGNOSIS — R7309 Other abnormal glucose: Secondary | ICD-10-CM

## 2012-11-18 DIAGNOSIS — E669 Obesity, unspecified: Secondary | ICD-10-CM

## 2012-11-18 DIAGNOSIS — R5381 Other malaise: Secondary | ICD-10-CM

## 2012-11-18 DIAGNOSIS — R739 Hyperglycemia, unspecified: Secondary | ICD-10-CM

## 2012-11-18 LAB — CBC WITH DIFFERENTIAL/PLATELET
Basophils Absolute: 0 10*3/uL (ref 0.0–0.1)
HCT: 41.3 % (ref 36.0–46.0)
Hemoglobin: 13.4 g/dL (ref 12.0–15.0)
Lymphs Abs: 2 10*3/uL (ref 0.7–4.0)
MCHC: 32.5 g/dL (ref 30.0–36.0)
MCV: 82.9 fl (ref 78.0–100.0)
Monocytes Absolute: 0.7 10*3/uL (ref 0.1–1.0)
Monocytes Relative: 8.5 % (ref 3.0–12.0)
Neutro Abs: 5.3 10*3/uL (ref 1.4–7.7)
RDW: 14.5 % (ref 11.5–14.6)

## 2012-11-18 LAB — LIPID PANEL
Cholesterol: 206 mg/dL — ABNORMAL HIGH (ref 0–200)
Total CHOL/HDL Ratio: 4

## 2012-11-18 LAB — HEPATIC FUNCTION PANEL
AST: 15 U/L (ref 0–37)
Albumin: 4.2 g/dL (ref 3.5–5.2)
Alkaline Phosphatase: 57 U/L (ref 39–117)
Total Bilirubin: 0.7 mg/dL (ref 0.3–1.2)

## 2012-11-18 LAB — BASIC METABOLIC PANEL
CO2: 25 mEq/L (ref 19–32)
Calcium: 8.8 mg/dL (ref 8.4–10.5)
Creatinine, Ser: 0.9 mg/dL (ref 0.4–1.2)

## 2012-11-18 MED ORDER — ALBUTEROL SULFATE HFA 108 (90 BASE) MCG/ACT IN AERS: 2.0000 | INHALATION_SPRAY | RESPIRATORY_TRACT | Status: DC | PRN

## 2012-11-18 NOTE — Assessment & Plan Note (Signed)
BP Readings from Last 3 Encounters:  11/18/12 130/80  01/14/12 162/66  12/09/11 130/90   Well controlled- check labs today

## 2012-11-18 NOTE — Assessment & Plan Note (Signed)
Needs to address weight- Advised aggressive weight loss Daily exercise

## 2012-11-18 NOTE — Progress Notes (Signed)
Patient ID: Sheena Velasquez, female   DOB: [birth year], 57 y.o.   MRN: [id number]  htn- tolerating meds without difficulty  gerd- no sxs on meds  Bronchospasm-- no real sxs on meds (uses rarely)  Reviewed pmh, psh, sochx, meds   patient denies chest pain, shortness of breath, orthopnea. Denies lower extremity edema, abdominal pain, change in appetite, change in bowel movements. Patient denies rashes, musculoskeletal complaints. No other specific complaints in a complete review of systems.    well-developed well-nourished female (overweight) in no acute distress. HEENT exam atraumatic, normocephalic, neck supple without jugular venous distention. Chest clear to auscultation cardiac exam S1-S2 are regular. Abdominal exam overweight with bowel sounds, soft and nontender. Extremities no edema. Neurologic exam is alert with a normal gait.

## 2012-11-18 NOTE — Assessment & Plan Note (Signed)
She needs further evaluation- Schedule PFTs  Refill SABA, may need long term steroid inhaler

## 2012-11-18 NOTE — Assessment & Plan Note (Signed)
No sxs on meds Check cbc

## 2012-11-19 ENCOUNTER — Encounter: Payer: Self-pay | Admitting: Internal Medicine

## 2012-11-20 ENCOUNTER — Encounter: Payer: Self-pay | Admitting: Internal Medicine

## 2012-12-10 ENCOUNTER — Ambulatory Visit (INDEPENDENT_AMBULATORY_CARE_PROVIDER_SITE_OTHER): Payer: Federal, State, Local not specified - PPO | Admitting: Internal Medicine

## 2012-12-10 DIAGNOSIS — J45909 Unspecified asthma, uncomplicated: Secondary | ICD-10-CM

## 2012-12-10 LAB — PULMONARY FUNCTION TEST

## 2012-12-10 NOTE — Progress Notes (Signed)
PFT done today. 

## 2012-12-21 ENCOUNTER — Encounter: Payer: Self-pay | Admitting: Internal Medicine

## 2013-01-04 ENCOUNTER — Other Ambulatory Visit: Payer: Self-pay | Admitting: Internal Medicine

## 2013-04-08 ENCOUNTER — Other Ambulatory Visit: Payer: Self-pay

## 2013-04-27 ENCOUNTER — Other Ambulatory Visit: Payer: Self-pay | Admitting: Internal Medicine

## 2013-05-21 ENCOUNTER — Other Ambulatory Visit: Payer: Self-pay | Admitting: Internal Medicine

## 2013-08-04 ENCOUNTER — Other Ambulatory Visit: Payer: Self-pay | Admitting: Internal Medicine

## 2013-12-15 ENCOUNTER — Other Ambulatory Visit: Payer: Self-pay | Admitting: Internal Medicine

## 2014-02-01 ENCOUNTER — Other Ambulatory Visit: Payer: Self-pay | Admitting: Internal Medicine

## 2014-03-15 ENCOUNTER — Ambulatory Visit (INDEPENDENT_AMBULATORY_CARE_PROVIDER_SITE_OTHER): Payer: Federal, State, Local not specified - PPO | Admitting: Family

## 2014-03-15 ENCOUNTER — Encounter: Payer: Self-pay | Admitting: Family

## 2014-03-15 VITALS — BP 120/84 | HR 75 | Ht 63.0 in | Wt 201.0 lb

## 2014-03-15 DIAGNOSIS — I10 Essential (primary) hypertension: Secondary | ICD-10-CM

## 2014-03-15 DIAGNOSIS — Z1231 Encounter for screening mammogram for malignant neoplasm of breast: Secondary | ICD-10-CM

## 2014-03-15 DIAGNOSIS — Z23 Encounter for immunization: Secondary | ICD-10-CM

## 2014-03-15 DIAGNOSIS — K219 Gastro-esophageal reflux disease without esophagitis: Secondary | ICD-10-CM

## 2014-03-15 DIAGNOSIS — G4700 Insomnia, unspecified: Secondary | ICD-10-CM

## 2014-03-15 MED ORDER — HYDROCHLOROTHIAZIDE 25 MG PO TABS: ORAL_TABLET | ORAL | Status: DC

## 2014-03-15 MED ORDER — LISINOPRIL 40 MG PO TABS: ORAL_TABLET | ORAL | Status: DC

## 2014-03-15 MED ORDER — TEMAZEPAM 15 MG PO CAPS: ORAL_CAPSULE | ORAL | Status: DC

## 2014-03-15 MED ORDER — FELODIPINE ER 10 MG PO TB24: ORAL_TABLET | ORAL | Status: DC

## 2014-03-15 NOTE — Patient Instructions (Signed)
Exercise to Stay Healthy Exercise helps you become and stay healthy. EXERCISE IDEAS AND TIPS Choose exercises that:  You enjoy.  Fit into your day. You do not need to exercise really hard to be healthy. You can do exercises at a slow or medium level and stay healthy. You can:  Stretch before and after working out.  Try yoga, Pilates, or tai chi.  Lift weights.  Walk fast, swim, jog, run, climb stairs, bicycle, dance, or rollerskate.  Take aerobic classes. Exercises that burn about 150 calories:  Running 1  miles in 15 minutes.  Playing volleyball for 45 to 60 minutes.  Washing and waxing a car for 45 to 60 minutes.  Playing touch football for 45 minutes.  Walking 1  miles in 35 minutes.  Pushing a stroller 1  miles in 30 minutes.  Playing basketball for 30 minutes.  Raking leaves for 30 minutes.  Bicycling 5 miles in 30 minutes.  Walking 2 miles in 30 minutes.  Dancing for 30 minutes.  Shoveling snow for 15 minutes.  Swimming laps for 20 minutes.  Walking up stairs for 15 minutes.  Bicycling 4 miles in 15 minutes.  Gardening for 30 to 45 minutes.  Jumping rope for 15 minutes.  Washing windows or floors for 45 to 60 minutes. Document Released: 06/22/2010 Document Revised: 08/12/2011 Document Reviewed: 06/22/2010 Exit[employer] Patient Information 2015 [employer]. This information is not intended to replace advice given to you by your health care provider. Make sure you discuss any questions you have with your health care provider.  

## 2014-03-16 ENCOUNTER — Telehealth: Payer: Self-pay | Admitting: Family

## 2014-03-16 ENCOUNTER — Encounter: Payer: Self-pay | Admitting: Family

## 2014-03-16 LAB — COMPREHENSIVE METABOLIC PANEL
ALT: 15 U/L (ref 0–35)
AST: 22 U/L (ref 0–37)
Albumin: 3.7 g/dL (ref 3.5–5.2)
Alkaline Phosphatase: 59 U/L (ref 39–117)
BILIRUBIN TOTAL: 0.4 mg/dL (ref 0.2–1.2)
BUN: 22 mg/dL (ref 6–23)
CO2: 28 meq/L (ref 19–32)
Calcium: 9.4 mg/dL (ref 8.4–10.5)
Chloride: 105 mEq/L (ref 96–112)
Creatinine, Ser: 1.2 mg/dL (ref 0.4–1.2)
GFR: 58.14 mL/min — AB (ref >60.00)
Glucose, Bld: 85 mg/dL (ref 70–99)
Potassium: 3.7 mEq/L (ref 3.5–5.1)
Sodium: 139 mEq/L (ref 135–145)
Total Protein: 7.6 g/dL (ref 6.0–8.3)

## 2014-03-16 NOTE — Telephone Encounter (Signed)
emmi emailed °
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
## 2014-03-30 ENCOUNTER — Other Ambulatory Visit: Payer: Self-pay | Admitting: Family

## 2014-03-30 DIAGNOSIS — D242 Benign neoplasm of left breast: Secondary | ICD-10-CM

## 2014-04-11 ENCOUNTER — Encounter: Payer: Self-pay | Admitting: Family

## 2014-06-02 ENCOUNTER — Encounter: Payer: Self-pay | Admitting: Family

## 2014-09-05 ENCOUNTER — Other Ambulatory Visit: Payer: Federal, State, Local not specified - PPO

## 2014-09-12 ENCOUNTER — Encounter: Payer: Federal, State, Local not specified - PPO | Admitting: Family

## 2014-10-14 ENCOUNTER — Telehealth: Payer: Self-pay

## 2014-10-14 NOTE — Telephone Encounter (Signed)
Left message for pt to call back concerning need for mammogram screening.

## 2014-12-21 ENCOUNTER — Encounter: Payer: Self-pay | Admitting: Family

## 2014-12-21 ENCOUNTER — Ambulatory Visit (INDEPENDENT_AMBULATORY_CARE_PROVIDER_SITE_OTHER): Payer: Federal, State, Local not specified - PPO | Admitting: Family

## 2014-12-21 VITALS — BP 110/72 | HR 76 | Temp 98.3°F | Wt 205.0 lb

## 2014-12-21 DIAGNOSIS — G4700 Insomnia, unspecified: Secondary | ICD-10-CM | POA: Diagnosis not present

## 2014-12-21 DIAGNOSIS — I10 Essential (primary) hypertension: Secondary | ICD-10-CM | POA: Diagnosis not present

## 2014-12-21 DIAGNOSIS — K2100 Gastro-esophageal reflux disease with esophagitis, without bleeding: Secondary | ICD-10-CM

## 2014-12-21 LAB — BASIC METABOLIC PANEL
BUN: 16 mg/dL (ref 6–23)
CALCIUM: 9.7 mg/dL (ref 8.4–10.5)
CHLORIDE: 101 meq/L (ref 96–112)
CO2: 31 mEq/L (ref 19–32)
Creatinine, Ser: 0.82 mg/dL (ref 0.40–1.20)
GFR: 91.72 mL/min (ref >60.00)
GLUCOSE: 113 mg/dL — AB (ref 70–99)
POTASSIUM: 3.7 meq/L (ref 3.5–5.1)
Sodium: 139 mEq/L (ref 135–145)

## 2014-12-21 LAB — HEPATIC FUNCTION PANEL
ALT: 14 U/L (ref 0–35)
AST: 15 U/L (ref 0–37)
Albumin: 4.2 g/dL (ref 3.5–5.2)
Alkaline Phosphatase: 69 U/L (ref 39–117)
BILIRUBIN TOTAL: 0.4 mg/dL (ref 0.2–1.2)
Bilirubin, Direct: 0.1 mg/dL (ref 0.0–0.3)
TOTAL PROTEIN: 6.9 g/dL (ref 6.0–8.3)

## 2014-12-21 MED ORDER — LISINOPRIL 40 MG PO TABS: ORAL_TABLET | ORAL | Status: DC

## 2014-12-21 MED ORDER — FELODIPINE ER 10 MG PO TB24: ORAL_TABLET | ORAL | Status: DC

## 2014-12-21 MED ORDER — HYDROCHLOROTHIAZIDE 25 MG PO TABS: ORAL_TABLET | ORAL | Status: DC

## 2014-12-21 NOTE — Patient Instructions (Signed)
Fat and Cholesterol Control Diet Fat and cholesterol levels in your blood and organs are influenced by your diet. High levels of fat and cholesterol may lead to diseases of the heart, small and large blood vessels, gallbladder, liver, and pancreas. CONTROLLING FAT AND CHOLESTEROL WITH DIET Although exercise and lifestyle factors are important, your diet is key. That is because certain foods are known to raise cholesterol and others to lower it. The goal is to balance foods for their effect on cholesterol and more importantly, to replace saturated and trans fat with other types of fat, such as monounsaturated fat, polyunsaturated fat, and omega-3 fatty acids. On average, a person should consume no more than 15 to 17 g of saturated fat daily. Saturated and trans fats are considered "bad" fats, and they will raise LDL cholesterol. Saturated fats are primarily found in animal products such as meats, butter, and cream. However, that does not mean you need to give up all your favorite foods. Today, there are good tasting, low-fat, low-cholesterol substitutes for most of the things you like to eat. Choose low-fat or nonfat alternatives. Choose round or loin cuts of red meat. These types of cuts are lowest in fat and cholesterol. Chicken (without the skin), fish, veal, and ground turkey breast are great choices. Eliminate fatty meats, such as hot dogs and salami. Even shellfish have little or no saturated fat. Have a 3 oz (85 g) portion when you eat lean meat, poultry, or fish. Trans fats are also called "partially hydrogenated oils." They are oils that have been scientifically manipulated so that they are solid at room temperature resulting in a longer shelf life and improved taste and texture of foods in which they are added. Trans fats are found in stick margarine, some tub margarines, cookies, crackers, and baked goods.  When baking and cooking, oils are a great substitute for butter. The monounsaturated oils are  especially beneficial since it is believed they lower LDL and raise HDL. The oils you should avoid entirely are saturated tropical oils, such as coconut and palm.  Remember to eat a lot from food groups that are naturally free of saturated and trans fat, including fish, fruit, vegetables, beans, grains (barley, rice, couscous, bulgur wheat), and pasta (without cream sauces).  IDENTIFYING FOODS THAT LOWER FAT AND CHOLESTEROL  Soluble fiber may lower your cholesterol. This type of fiber is found in fruits such as apples, vegetables such as broccoli, potatoes, and carrots, legumes such as beans, peas, and lentils, and grains such as barley. Foods fortified with plant sterols (phytosterol) may also lower cholesterol. You should eat at least 2 g per day of these foods for a cholesterol lowering effect.  Read package labels to identify low-saturated fats, trans fat free, and low-fat foods at the supermarket. Select cheeses that have only 2 to 3 g saturated fat per ounce. Use a heart-healthy tub margarine that is free of trans fats or partially hydrogenated oil. When buying baked goods (cookies, crackers), avoid partially hydrogenated oils. Breads and muffins should be made from whole grains (whole-wheat or whole oat flour, instead of "flour" or "enriched flour"). Buy non-creamy canned soups with reduced salt and no added fats.  FOOD PREPARATION TECHNIQUES  Never deep-fry. If you must fry, either stir-fry, which uses very little fat, or use non-stick cooking sprays. When possible, broil, bake, or roast meats, and steam vegetables. Instead of putting butter or margarine on vegetables, use lemon and herbs, applesauce, and cinnamon (for squash and sweet potatoes). Use nonfat   yogurt, salsa, and low-fat dressings for salads.  LOW-SATURATED FAT / LOW-FAT FOOD SUBSTITUTES Meats / Saturated Fat (g)  Avoid: Steak, marbled (3 oz/85 g) / 11 g  Choose: Steak, lean (3 oz/85 g) / 4 g  Avoid: Hamburger (3 oz/85 g) / 7  g  Choose: Hamburger, lean (3 oz/85 g) / 5 g  Avoid: Ham (3 oz/85 g) / 6 g  Choose: Ham, lean cut (3 oz/85 g) / 2.4 g  Avoid: Chicken, with skin, dark meat (3 oz/85 g) / 4 g  Choose: Chicken, skin removed, dark meat (3 oz/85 g) / 2 g  Avoid: Chicken, with skin, light meat (3 oz/85 g) / 2.5 g  Choose: Chicken, skin removed, light meat (3 oz/85 g) / 1 g Dairy / Saturated Fat (g)  Avoid: Whole milk (1 cup) / 5 g  Choose: Low-fat milk, 2% (1 cup) / 3 g  Choose: Low-fat milk, 1% (1 cup) / 1.5 g  Choose: Skim milk (1 cup) / 0.3 g  Avoid: Hard cheese (1 oz/28 g) / 6 g  Choose: Skim milk cheese (1 oz/28 g) / 2 to 3 g  Avoid: Cottage cheese, 4% fat (1 cup) / 6.5 g  Choose: Low-fat cottage cheese, 1% fat (1 cup) / 1.5 g  Avoid: Ice cream (1 cup) / 9 g  Choose: Sherbet (1 cup) / 2.5 g  Choose: Nonfat frozen yogurt (1 cup) / 0.3 g  Choose: Frozen fruit bar / trace  Avoid: Whipped cream (1 tbs) / 3.5 g  Choose: Nondairy whipped topping (1 tbs) / 1 g Condiments / Saturated Fat (g)  Avoid: Mayonnaise (1 tbs) / 2 g  Choose: Low-fat mayonnaise (1 tbs) / 1 g  Avoid: Butter (1 tbs) / 7 g  Choose: Extra light margarine (1 tbs) / 1 g  Avoid: Coconut oil (1 tbs) / 11.8 g  Choose: Olive oil (1 tbs) / 1.8 g  Choose: Corn oil (1 tbs) / 1.7 g  Choose: Safflower oil (1 tbs) / 1.2 g  Choose: Sunflower oil (1 tbs) / 1.4 g  Choose: Soybean oil (1 tbs) / 2.4 g  Choose: Canola oil (1 tbs) / 1 g Document Released: 05/20/2005 Document Revised: 09/14/2012 Document Reviewed: 08/18/2013 Exit[employer] Patient Information 2015 [employer]. This information is not intended to replace advice given to you by your health care provider. Make sure you discuss any questions you have with your health care provider.  
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
## 2015-01-13 ENCOUNTER — Ambulatory Visit (INDEPENDENT_AMBULATORY_CARE_PROVIDER_SITE_OTHER): Payer: Federal, State, Local not specified - PPO | Admitting: Adult Health

## 2015-01-13 ENCOUNTER — Encounter: Payer: Self-pay | Admitting: Adult Health

## 2015-01-13 VITALS — BP 128/70 | Temp 98.4°F | Ht 63.0 in | Wt 203.3 lb

## 2015-01-13 DIAGNOSIS — I10 Essential (primary) hypertension: Secondary | ICD-10-CM | POA: Diagnosis not present

## 2015-01-13 DIAGNOSIS — E669 Obesity, unspecified: Secondary | ICD-10-CM

## 2015-01-13 DIAGNOSIS — Z7189 Other specified counseling: Secondary | ICD-10-CM

## 2015-01-13 DIAGNOSIS — Z7689 Persons encountering health services in other specified circumstances: Secondary | ICD-10-CM

## 2015-01-13 DIAGNOSIS — Z760 Encounter for issue of repeat prescription: Secondary | ICD-10-CM

## 2015-01-13 MED ORDER — TEMAZEPAM 15 MG PO CAPS: ORAL_CAPSULE | ORAL | Status: DC

## 2015-01-13 NOTE — Patient Instructions (Signed)
It was great meeting you today!  I have sent in a prescription for Restoril.   Please start eating healthy and exercising so we can hopefully take you off some blood pressure medication.    Follow up for a complete physical or sooner if needed.

## 2015-01-13 NOTE — Progress Notes (Signed)
Pre visit review using our clinic review tool, if applicable. No additional management support is needed unless otherwise documented below in the visit note. 
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
## 2015-02-09 ENCOUNTER — Other Ambulatory Visit: Payer: Self-pay | Admitting: Adult Health

## 2015-02-09 DIAGNOSIS — D242 Benign neoplasm of left breast: Secondary | ICD-10-CM

## 2015-02-20 ENCOUNTER — Other Ambulatory Visit: Payer: Federal, State, Local not specified - PPO

## 2015-02-21 ENCOUNTER — Ambulatory Visit
Admission: RE | Admit: 2015-02-21 | Discharge: 2015-02-21 | Disposition: A | Discharge status: Home / Self Care | Payer: Federal, State, Local not specified - PPO | Source: Ambulatory Visit | Attending: Adult Health | Admitting: Adult Health

## 2015-02-21 ENCOUNTER — Other Ambulatory Visit (INDEPENDENT_AMBULATORY_CARE_PROVIDER_SITE_OTHER): Payer: Federal, State, Local not specified - PPO

## 2015-02-21 ENCOUNTER — Encounter: Payer: Self-pay | Admitting: Adult Health

## 2015-02-21 DIAGNOSIS — Z0000 Encounter for general adult medical examination without abnormal findings: Secondary | ICD-10-CM

## 2015-02-21 DIAGNOSIS — D242 Benign neoplasm of left breast: Secondary | ICD-10-CM

## 2015-02-21 LAB — CBC WITH DIFFERENTIAL/PLATELET
BASOS ABS: 0 10*3/uL (ref 0.0–0.1)
Basophils Relative: 0.4 % (ref 0.0–3.0)
EOS ABS: 0.2 10*3/uL (ref 0.0–0.7)
Eosinophils Relative: 2.6 % (ref 0.0–5.0)
HEMATOCRIT: 41 % (ref 36.0–46.0)
Hemoglobin: 13.3 g/dL (ref 12.0–15.0)
LYMPHS PCT: 21.5 % (ref 12.0–46.0)
Lymphs Abs: 1.9 10*3/uL (ref 0.7–4.0)
MCHC: 32.5 g/dL (ref 30.0–36.0)
MCV: 81.4 fl (ref 78.0–100.0)
Monocytes Absolute: 0.6 10*3/uL (ref 0.1–1.0)
Monocytes Relative: 6.6 % (ref 3.0–12.0)
NEUTROS ABS: 6.1 10*3/uL (ref 1.4–7.7)
NEUTROS PCT: 68.9 % (ref 43.0–77.0)
PLATELETS: 265 10*3/uL (ref 150.0–400.0)
RBC: 5.03 Mil/uL (ref 3.87–5.11)
RDW: 14.2 % (ref 11.5–15.5)
WBC: 8.9 10*3/uL (ref 4.0–10.5)

## 2015-02-21 LAB — HEPATIC FUNCTION PANEL
ALBUMIN: 4 g/dL (ref 3.5–5.2)
ALK PHOS: 63 U/L (ref 39–117)
ALT: 14 U/L (ref 0–35)
AST: 14 U/L (ref 0–37)
BILIRUBIN DIRECT: 0.1 mg/dL (ref 0.0–0.3)
TOTAL PROTEIN: 6.9 g/dL (ref 6.0–8.3)
Total Bilirubin: 0.7 mg/dL (ref 0.2–1.2)

## 2015-02-21 LAB — BASIC METABOLIC PANEL
BUN: 20 mg/dL (ref 6–23)
CHLORIDE: 103 meq/L (ref 96–112)
CO2: 28 meq/L (ref 19–32)
Calcium: 9.3 mg/dL (ref 8.4–10.5)
Creatinine, Ser: 0.81 mg/dL (ref 0.40–1.20)
GFR: 92.98 mL/min (ref >60.00)
GLUCOSE: 82 mg/dL (ref 70–99)
Potassium: 3.8 mEq/L (ref 3.5–5.1)
SODIUM: 141 meq/L (ref 135–145)

## 2015-02-21 LAB — POCT URINALYSIS DIPSTICK
Bilirubin, UA: NEGATIVE
Blood, UA: NEGATIVE
Glucose, UA: NEGATIVE
Ketones, UA: NEGATIVE
Leukocytes, UA: NEGATIVE
Nitrite, UA: NEGATIVE
Protein, UA: NEGATIVE
Spec Grav, UA: >=1.03
Urobilinogen, UA: 1
pH, UA: 6

## 2015-02-21 LAB — TSH: TSH: 1.59 u[IU]/mL (ref 0.35–4.50)

## 2015-02-21 LAB — LIPID PANEL
Cholesterol: 181 mg/dL (ref 0–200)
HDL: 60.3 mg/dL (ref >39.00)
LDL Cholesterol: 107 mg/dL — ABNORMAL HIGH (ref 0–99)
NonHDL: 120.83
Total CHOL/HDL Ratio: 3
Triglycerides: 69 mg/dL (ref 0.0–149.0)
VLDL: 13.8 mg/dL (ref 0.0–40.0)

## 2015-03-01 ENCOUNTER — Encounter: Payer: Federal, State, Local not specified - PPO | Admitting: Adult Health

## 2015-03-08 ENCOUNTER — Other Ambulatory Visit: Payer: Self-pay | Admitting: Adult Health

## 2015-03-09 ENCOUNTER — Other Ambulatory Visit: Payer: Self-pay | Admitting: Adult Health

## 2015-03-09 NOTE — Telephone Encounter (Signed)
Pls advise if ok to refill

## 2015-03-09 NOTE — Telephone Encounter (Signed)
Ok to refill for one month  

## 2015-05-23 ENCOUNTER — Other Ambulatory Visit: Payer: Self-pay | Admitting: Adult Health

## 2015-05-23 NOTE — Telephone Encounter (Signed)
Ok to refill 

## 2015-08-10 ENCOUNTER — Other Ambulatory Visit: Payer: Self-pay | Admitting: Adult Health

## 2015-08-10 NOTE — Telephone Encounter (Signed)
Ok to refill for one month  

## 2015-11-28 DIAGNOSIS — M961 Postlaminectomy syndrome, not elsewhere classified: Secondary | ICD-10-CM | POA: Diagnosis not present

## 2015-11-28 DIAGNOSIS — G894 Chronic pain syndrome: Secondary | ICD-10-CM | POA: Diagnosis not present

## 2015-11-28 DIAGNOSIS — Z79891 Long term (current) use of opiate analgesic: Secondary | ICD-10-CM | POA: Diagnosis not present

## 2015-11-28 DIAGNOSIS — M5136 Other intervertebral disc degeneration, lumbar region: Secondary | ICD-10-CM | POA: Diagnosis not present

## 2015-12-24 ENCOUNTER — Other Ambulatory Visit: Payer: Self-pay | Admitting: Family

## 2016-01-24 DIAGNOSIS — H2513 Age-related nuclear cataract, bilateral: Secondary | ICD-10-CM | POA: Diagnosis not present

## 2016-01-24 DIAGNOSIS — H40033 Anatomical narrow angle, bilateral: Secondary | ICD-10-CM | POA: Diagnosis not present

## 2016-03-10 ENCOUNTER — Other Ambulatory Visit: Payer: Self-pay | Admitting: Adult Health

## 2016-03-12 ENCOUNTER — Other Ambulatory Visit: Payer: Self-pay | Admitting: Adult Health

## 2016-03-12 MED ORDER — TEMAZEPAM 15 MG PO CAPS: ORAL_CAPSULE | ORAL | 0 refills | Status: DC

## 2016-03-28 DIAGNOSIS — Z79891 Long term (current) use of opiate analgesic: Secondary | ICD-10-CM | POA: Diagnosis not present

## 2016-03-28 DIAGNOSIS — M961 Postlaminectomy syndrome, not elsewhere classified: Secondary | ICD-10-CM | POA: Diagnosis not present

## 2016-03-28 DIAGNOSIS — G894 Chronic pain syndrome: Secondary | ICD-10-CM | POA: Diagnosis not present

## 2016-04-30 ENCOUNTER — Other Ambulatory Visit: Payer: Self-pay | Admitting: Adult Health

## 2016-04-30 NOTE — Telephone Encounter (Signed)
[place] for 90 days of each. She needs to have a physical for further refills.

## 2016-04-30 NOTE — Telephone Encounter (Signed)
Ok to refill 

## 2016-05-01 ENCOUNTER — Other Ambulatory Visit: Payer: Self-pay | Admitting: Adult Health

## 2016-07-09 ENCOUNTER — Other Ambulatory Visit: Payer: Self-pay | Admitting: Adult Health

## 2016-07-09 NOTE — Telephone Encounter (Signed)
Ok to refill 

## 2016-07-09 NOTE — Telephone Encounter (Signed)
Rx called in as directed.   

## 2016-07-09 NOTE — Telephone Encounter (Signed)
Ok to refill for one month  

## 2016-08-15 DIAGNOSIS — M961 Postlaminectomy syndrome, not elsewhere classified: Secondary | ICD-10-CM | POA: Diagnosis not present

## 2016-08-15 DIAGNOSIS — G894 Chronic pain syndrome: Secondary | ICD-10-CM | POA: Diagnosis not present

## 2016-08-15 DIAGNOSIS — Z79891 Long term (current) use of opiate analgesic: Secondary | ICD-10-CM | POA: Diagnosis not present

## 2016-09-02 ENCOUNTER — Other Ambulatory Visit: Payer: Self-pay | Admitting: Adult Health

## 2016-09-03 ENCOUNTER — Other Ambulatory Visit: Payer: Self-pay | Admitting: Adult Health

## 2016-09-03 NOTE — Telephone Encounter (Signed)
She can have 30 days. Please inform her that she is due for a physical

## 2016-09-03 NOTE — Telephone Encounter (Signed)
Patient last seen 01/13/15 - ok to refill?

## 2016-10-16 ENCOUNTER — Other Ambulatory Visit: Payer: Self-pay | Admitting: Adult Health

## 2016-10-20 ENCOUNTER — Other Ambulatory Visit: Payer: Self-pay | Admitting: Adult Health

## 2016-11-06 ENCOUNTER — Encounter: Payer: Self-pay | Admitting: Adult Health

## 2016-11-06 ENCOUNTER — Other Ambulatory Visit: Payer: Self-pay | Admitting: Adult Health

## 2016-11-06 ENCOUNTER — Ambulatory Visit (INDEPENDENT_AMBULATORY_CARE_PROVIDER_SITE_OTHER): Payer: Federal, State, Local not specified - PPO | Admitting: Adult Health

## 2016-11-06 VITALS — BP 140/96 | HR 88 | Temp 98.3°F | Wt 201.5 lb

## 2016-11-06 DIAGNOSIS — J069 Acute upper respiratory infection, unspecified: Secondary | ICD-10-CM | POA: Diagnosis not present

## 2016-11-06 MED ORDER — IPRATROPIUM-ALBUTEROL 0.5-2.5 (3) MG/3ML IN SOLN
3.0000 mL | Freq: Once | RESPIRATORY_TRACT | Status: AC
  Administered 2016-11-06: 3 mL via RESPIRATORY_TRACT

## 2016-11-06 MED ORDER — AZITHROMYCIN 250 MG PO TABS: ORAL_TABLET | ORAL | 0 refills | Status: DC

## 2016-11-06 MED ORDER — ALBUTEROL SULFATE (2.5 MG/3ML) 0.083% IN NEBU
2.5000 mg | INHALATION_SOLUTION | Freq: Once | RESPIRATORY_TRACT | Status: AC
  Administered 2016-11-06: 2.5 mg via RESPIRATORY_TRACT

## 2016-11-06 MED ORDER — PREDNISONE 10 MG PO TABS: ORAL_TABLET | ORAL | 0 refills | Status: DC

## 2016-11-06 MED ORDER — HYDROCODONE-HOMATROPINE 5-1.5 MG/5ML PO SYRP: 5.0000 mL | ORAL_SOLUTION | Freq: Three times a day (TID) | ORAL | 0 refills | Status: DC | PRN

## 2016-11-06 NOTE — Patient Instructions (Addendum)
It was great seeing you today. I am sorry you are feeling this way.   I have sent in a prescription for prednisone and azithroycin. Take these as directed. Continue to use your inhaler as needed.   It was also be a good idea to use a humidifier at the bedside.   Follow up if no improvement    Upper Respiratory Infection, Adult Most upper respiratory infections (URIs) are caused by a virus. A URI affects the nose, throat, and upper air passages. The most common type of URI is often called "the common cold." Follow these instructions at home:  Take medicines only as told by your doctor.  Gargle warm saltwater or take cough drops to comfort your throat as told by your doctor.  Use a warm mist humidifier or inhale steam from a shower to increase air moisture. This may make it easier to breathe.  Drink enough fluid to keep your pee (urine) clear or pale yellow.  Eat soups and other clear broths.  Have a healthy diet.  Rest as needed.  Go back to work when your fever is gone or your doctor says it is okay. ? You may need to stay home longer to avoid giving your URI to others. ? You can also wear a face mask and wash your hands often to prevent spread of the virus.  Use your inhaler more if you have asthma.  Do not use any tobacco products, including cigarettes, chewing tobacco, or electronic cigarettes. If you need help quitting, ask your doctor. Contact a doctor if:  You are getting worse, not better.  Your symptoms are not helped by medicine.  You have chills.  You are getting more short of breath.  You have brown or red mucus.  You have yellow or brown discharge from your nose.  You have pain in your face, especially when you bend forward.  You have a fever.  You have puffy (swollen) neck glands.  You have pain while swallowing.  You have white areas in the back of your throat. Get help right away if:  You have very bad or constant: ? Headache. ? Ear  pain. ? Pain in your forehead, behind your eyes, and over your cheekbones (sinus pain). ? Chest pain.  You have long-lasting (chronic) lung disease and any of the following: ? Wheezing. ? Long-lasting cough. ? Coughing up blood. ? A change in your usual mucus.  You have a stiff neck.  You have changes in your: ? Vision. ? Hearing. ? Thinking. ? Mood. This information is not intended to replace advice given to you by your health care provider. Make sure you discuss any questions you have with your health care provider. Document Released: 11/06/2007 Document Revised: 01/21/2016 Document Reviewed: 08/25/2013 Elsevier Interactive Patient Education  2018 [employer].

## 2016-11-06 NOTE — Addendum Note (Signed)
Addended by: Apolinar Junes on: 11/06/2016 02:13 PM   Modules accepted: Orders
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
## 2016-11-08 ENCOUNTER — Ambulatory Visit (INDEPENDENT_AMBULATORY_CARE_PROVIDER_SITE_OTHER): Payer: Federal, State, Local not specified - PPO | Admitting: Adult Health

## 2016-11-08 ENCOUNTER — Encounter: Payer: Self-pay | Admitting: Adult Health

## 2016-11-08 VITALS — BP 130/90 | HR 77 | Temp 97.5°F | Ht 62.0 in | Wt 199.6 lb

## 2016-11-08 DIAGNOSIS — Z0000 Encounter for general adult medical examination without abnormal findings: Secondary | ICD-10-CM | POA: Diagnosis not present

## 2016-11-08 DIAGNOSIS — Z1231 Encounter for screening mammogram for malignant neoplasm of breast: Secondary | ICD-10-CM | POA: Diagnosis not present

## 2016-11-08 DIAGNOSIS — F5101 Primary insomnia: Secondary | ICD-10-CM

## 2016-11-08 DIAGNOSIS — K2100 Gastro-esophageal reflux disease with esophagitis, without bleeding: Secondary | ICD-10-CM

## 2016-11-08 DIAGNOSIS — I10 Essential (primary) hypertension: Secondary | ICD-10-CM

## 2016-11-08 LAB — CBC WITH DIFFERENTIAL/PLATELET
BASOS ABS: 0 {cells}/uL (ref 0–200)
Basophils Relative: 0 %
EOS PCT: 0 %
Eosinophils Absolute: 0 cells/uL — ABNORMAL LOW (ref 15–500)
HCT: 42.6 % (ref 35.0–45.0)
HEMOGLOBIN: 13.4 g/dL (ref 11.7–15.5)
LYMPHS ABS: 1044 {cells}/uL (ref 850–3900)
Lymphocytes Relative: 12 %
MCH: 26 pg — ABNORMAL LOW (ref 27.0–33.0)
MCHC: 31.5 g/dL — ABNORMAL LOW (ref 32.0–36.0)
MCV: 82.7 fL (ref 80.0–100.0)
MPV: 9.5 fL (ref 7.5–12.5)
Monocytes Absolute: 261 cells/uL (ref 200–950)
Monocytes Relative: 3 %
NEUTROS ABS: 7395 {cells}/uL (ref 1500–7800)
Neutrophils Relative %: 85 %
Platelets: 290 10*3/uL (ref 140–400)
RBC: 5.15 MIL/uL — AB (ref 3.80–5.10)
RDW: 14.5 % (ref 11.0–15.0)
WBC: 8.7 10*3/uL (ref 3.8–10.8)

## 2016-11-08 LAB — TSH: TSH: 0.44 m[IU]/L

## 2016-11-08 MED ORDER — OMEPRAZOLE 20 MG PO CPDR: 20.0000 mg | DELAYED_RELEASE_CAPSULE | Freq: Every day | ORAL | 3 refills | Status: DC

## 2016-11-08 MED ORDER — HYDROCHLOROTHIAZIDE 25 MG PO TABS: 25.0000 mg | ORAL_TABLET | Freq: Every day | ORAL | 3 refills | Status: DC

## 2016-11-08 MED ORDER — LISINOPRIL 40 MG PO TABS: 40.0000 mg | ORAL_TABLET | Freq: Every day | ORAL | 3 refills | Status: DC

## 2016-11-08 MED ORDER — TEMAZEPAM 15 MG PO CAPS: 15.0000 mg | ORAL_CAPSULE | Freq: Every evening | ORAL | 0 refills | Status: DC | PRN

## 2016-11-08 MED ORDER — FELODIPINE ER 10 MG PO TB24: 10.0000 mg | ORAL_TABLET | Freq: Every day | ORAL | 3 refills | Status: DC

## 2016-11-08 NOTE — Patient Instructions (Signed)
It was great seeing you today!  I will follow up with you regarding your blood work   Continue to work on losing weight   Please let me know if you need anything
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
## 2016-11-09 LAB — LIPID PANEL
CHOL/HDL RATIO: 3.3 ratio (ref <5.0)
Cholesterol: 219 mg/dL — ABNORMAL HIGH (ref <200)
HDL: 66 mg/dL (ref >50)
LDL Cholesterol: 140 mg/dL — ABNORMAL HIGH (ref <100)
Triglycerides: 66 mg/dL (ref <150)
VLDL: 13 mg/dL (ref <30)

## 2016-11-09 LAB — HEPATIC FUNCTION PANEL
ALK PHOS: 70 U/L (ref 33–130)
ALT: 12 U/L (ref 6–29)
AST: 12 U/L (ref 10–35)
Albumin: 4.2 g/dL (ref 3.6–5.1)
BILIRUBIN DIRECT: 0.1 mg/dL (ref <=0.2)
BILIRUBIN INDIRECT: 0.3 mg/dL (ref 0.2–1.2)
BILIRUBIN TOTAL: 0.4 mg/dL (ref 0.2–1.2)
Total Protein: 7.1 g/dL (ref 6.1–8.1)

## 2016-11-09 LAB — BASIC METABOLIC PANEL
BUN: 15 mg/dL (ref 7–25)
CALCIUM: 9.5 mg/dL (ref 8.6–10.4)
CO2: 23 mmol/L (ref 20–31)
Chloride: 107 mmol/L (ref 98–110)
Creat: 0.74 mg/dL (ref 0.50–0.99)
Glucose, Bld: 114 mg/dL — ABNORMAL HIGH (ref 65–99)
Potassium: 4.2 mmol/L (ref 3.5–5.3)
Sodium: 140 mmol/L (ref 135–146)

## 2016-11-09 LAB — HEMOGLOBIN A1C
HEMOGLOBIN A1C: 5.4 % (ref <5.7)
Mean Plasma Glucose: 108 mg/dL

## 2016-11-27 ENCOUNTER — Ambulatory Visit (INDEPENDENT_AMBULATORY_CARE_PROVIDER_SITE_OTHER): Payer: Federal, State, Local not specified - PPO | Admitting: Adult Health

## 2016-11-27 ENCOUNTER — Encounter: Payer: Self-pay | Admitting: Adult Health

## 2016-11-27 VITALS — BP 122/62

## 2016-11-27 DIAGNOSIS — J45901 Unspecified asthma with (acute) exacerbation: Secondary | ICD-10-CM

## 2016-11-27 DIAGNOSIS — R42 Dizziness and giddiness: Secondary | ICD-10-CM

## 2016-11-27 MED ORDER — FLUTICASONE FUROATE-VILANTEROL 100-25 MCG/INH IN AEPB: 1.0000 | INHALATION_SPRAY | Freq: Every day | RESPIRATORY_TRACT | 11 refills | Status: DC

## 2016-11-27 MED ORDER — IPRATROPIUM-ALBUTEROL 0.5-2.5 (3) MG/3ML IN SOLN: 3.0000 mL | Freq: Once | RESPIRATORY_TRACT | Status: DC

## 2016-11-27 MED ORDER — PREDNISONE 50 MG PO TABS: ORAL_TABLET | ORAL | 0 refills | Status: DC

## 2016-11-27 MED ORDER — MECLIZINE HCL 25 MG PO TABS: 25.0000 mg | ORAL_TABLET | Freq: Three times a day (TID) | ORAL | 0 refills | Status: DC | PRN
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
## 2016-11-27 NOTE — Patient Instructions (Addendum)
I have sent in a prescription for Prednisone 50 mg as well as a once daily inhaler called Breo   I have also sent in a prescription for Meclizine, take this three times a day as need for dizziness. This medicine may make you sleepy   Follow the directions for the [facility] Maneuver    Benign Positional Vertigo Vertigo is the feeling that you or your surroundings are moving when they are not. Benign positional vertigo is the most common form of vertigo. The cause of this condition is not serious (is benign). This condition is triggered by certain movements and positions (is positional). This condition can be dangerous if it occurs while you are doing something that could endanger you or others, such as driving. What are the causes? In many cases, the cause of this condition is not known. It may be caused by a disturbance in an area of the inner ear that helps your brain to sense movement and balance. This disturbance can be caused by a viral infection (labyrinthitis), head injury, or repetitive motion. What increases the risk? This condition is more likely to develop in:  Women.  People who are 42 years of age or older.  What are the signs or symptoms? Symptoms of this condition usually happen when you move your head or your eyes in different directions. Symptoms may start suddenly, and they usually last for less than a minute. Symptoms may include:  Loss of balance and falling.  Feeling like you are spinning or moving.  Feeling like your surroundings are spinning or moving.  Nausea and vomiting.  Blurred vision.  Dizziness.  Involuntary eye movement (nystagmus).  Symptoms can be mild and cause only slight annoyance, or they can be severe and interfere with daily life. Episodes of benign positional vertigo may return (recur) over time, and they may be triggered by certain movements. Symptoms may improve over time. How is this diagnosed? This condition is usually diagnosed by medical  history and a physical exam of the head, neck, and ears. You may be referred to a health care provider who specializes in ear, nose, and throat (ENT) problems (otolaryngologist) or a provider who specializes in disorders of the nervous system (neurologist). You may have additional testing, including:  MRI.  A CT scan.  Eye movement tests. Your health care provider may ask you to change positions quickly while he or she watches you for symptoms of benign positional vertigo, such as nystagmus. Eye movement may be tested with an electronystagmogram (ENG), caloric stimulation, the Dix-Hallpike test, or the roll test.  An electroencephalogram (EEG). This records electrical activity in your brain.  Hearing tests.  How is this treated? Usually, your health care provider will treat this by moving your head in specific positions to adjust your inner ear back to normal. Surgery may be needed in severe cases, but this is rare. In some cases, benign positional vertigo may resolve on its own in 2-4 weeks. Follow these instructions at home: Safety  Move slowly.Avoid sudden body or head movements.  Avoid driving.  Avoid operating heavy machinery.  Avoid doing any tasks that would be dangerous to you or others if a vertigo episode would occur.  If you have trouble walking or keeping your balance, try using a cane for stability. If you feel dizzy or unstable, sit down right away.  Return to your normal activities as told by your health care provider. Ask your health care provider what activities are safe for you. General instructions  Take over-the-counter and prescription medicines only as told by your health care provider.  Avoid certain positions or movements as told by your health care provider.  Drink enough fluid to keep your urine clear or pale yellow.  Keep all follow-up visits as told by your health care provider. This is important. Contact a health care provider if:  You have a  fever.  Your condition gets worse or you develop new symptoms.  Your family or friends notice any behavioral changes.  Your nausea or vomiting gets worse.  You have numbness or a "pins and needles" sensation. Get help right away if:  You have difficulty speaking or moving.  You are always dizzy.  You faint.  You develop severe headaches.  You have weakness in your legs or arms.  You have changes in your hearing or vision.  You develop a stiff neck.  You develop sensitivity to light. This information is not intended to replace advice given to you by your health care provider. Make sure you discuss any questions you have with your health care provider. Document Released: 02/25/2006 Document Revised: 10/26/2015 Document Reviewed: 09/12/2014 Elsevier Interactive Patient Education  [employer].

## 2016-12-12 DIAGNOSIS — M5136 Other intervertebral disc degeneration, lumbar region: Secondary | ICD-10-CM | POA: Diagnosis not present

## 2016-12-12 DIAGNOSIS — M961 Postlaminectomy syndrome, not elsewhere classified: Secondary | ICD-10-CM | POA: Diagnosis not present

## 2016-12-12 DIAGNOSIS — G894 Chronic pain syndrome: Secondary | ICD-10-CM | POA: Diagnosis not present

## 2016-12-16 DIAGNOSIS — G894 Chronic pain syndrome: Secondary | ICD-10-CM | POA: Diagnosis not present

## 2016-12-16 DIAGNOSIS — Z79891 Long term (current) use of opiate analgesic: Secondary | ICD-10-CM | POA: Diagnosis not present

## 2016-12-16 DIAGNOSIS — M961 Postlaminectomy syndrome, not elsewhere classified: Secondary | ICD-10-CM | POA: Diagnosis not present

## 2016-12-18 ENCOUNTER — Other Ambulatory Visit: Payer: Self-pay | Admitting: Adult Health

## 2016-12-18 NOTE — Telephone Encounter (Signed)
Called and spoke to the pharmacy.  They have both prescriptions on file from 11/08/16.  Informed pharmacy that I will deny this medication request.

## 2017-01-10 DIAGNOSIS — H40033 Anatomical narrow angle, bilateral: Secondary | ICD-10-CM | POA: Diagnosis not present

## 2017-01-10 DIAGNOSIS — H2513 Age-related nuclear cataract, bilateral: Secondary | ICD-10-CM | POA: Diagnosis not present

## 2017-02-21 ENCOUNTER — Encounter: Payer: Self-pay | Admitting: Adult Health

## 2017-03-24 ENCOUNTER — Other Ambulatory Visit: Payer: Self-pay | Admitting: Family Medicine

## 2017-03-24 DIAGNOSIS — F5101 Primary insomnia: Secondary | ICD-10-CM

## 2017-03-24 NOTE — Telephone Encounter (Signed)
Ok to refill for 30 days  

## 2017-03-25 NOTE — Telephone Encounter (Signed)
Called to the pharmacy and left on machine. 

## 2017-04-17 DIAGNOSIS — M961 Postlaminectomy syndrome, not elsewhere classified: Secondary | ICD-10-CM | POA: Diagnosis not present

## 2017-04-17 DIAGNOSIS — G894 Chronic pain syndrome: Secondary | ICD-10-CM | POA: Diagnosis not present

## 2017-04-17 DIAGNOSIS — M5136 Other intervertebral disc degeneration, lumbar region: Secondary | ICD-10-CM | POA: Diagnosis not present

## 2017-07-21 ENCOUNTER — Other Ambulatory Visit: Payer: Self-pay | Admitting: Adult Health

## 2017-07-21 DIAGNOSIS — F5101 Primary insomnia: Secondary | ICD-10-CM

## 2017-08-19 ENCOUNTER — Other Ambulatory Visit: Payer: Self-pay | Admitting: Adult Health

## 2017-08-19 DIAGNOSIS — K2100 Gastro-esophageal reflux disease with esophagitis, without bleeding: Secondary | ICD-10-CM

## 2017-08-20 NOTE — Telephone Encounter (Signed)
Filled on 11/08/16 for 1 year.  Medication request is too early.  Message sent to the pharmacy.

## 2017-08-21 DIAGNOSIS — K080 Exfoliation of teeth due to systemic causes: Secondary | ICD-10-CM | POA: Diagnosis not present

## 2017-08-22 ENCOUNTER — Telehealth: Payer: Self-pay | Admitting: Adult Health

## 2017-08-22 DIAGNOSIS — K2100 Gastro-esophageal reflux disease with esophagitis, without bleeding: Secondary | ICD-10-CM

## 2017-08-26 NOTE — Telephone Encounter (Signed)
Need authorization to send in script for omeprazole 20 mg BID.

## 2017-08-26 NOTE — Telephone Encounter (Signed)
Spoke with pharmacy and they state pt has been filling rx about every 2 months instead of every 3 months.   Spoke with pt and she states that she has been taking one in the AM and one in the PM to help with her GERD symptoms.   [place]

## 2017-08-26 NOTE — Telephone Encounter (Signed)
Patient calling to check status of her refill on Prilosec 20 mg. I advised pt on way the refill was denied and she would like to speak with Tommi Rumps or his nurse about this. She can be reached at [phone]

## 2017-08-26 NOTE — Telephone Encounter (Signed)
DENIED.  FILLED ON 11/08/2016 FOR 1 YEAR.  SHOULD HAVE REFILLS ON FILE.

## 2017-08-26 NOTE — Telephone Encounter (Signed)
Authorization given for Prilosec 20 mg BID

## 2017-08-27 MED ORDER — OMEPRAZOLE 20 MG PO CPDR: 20.0000 mg | DELAYED_RELEASE_CAPSULE | Freq: Two times a day (BID) | ORAL | 0 refills | Status: DC

## 2017-08-27 NOTE — Telephone Encounter (Signed)
Pt notified that new rx has been sent to the pharmacy.  No further action required.  Will close note.

## 2017-08-27 NOTE — Addendum Note (Signed)
Addended by: Miles Costain T on: 08/27/2017 08:54 AM   Modules accepted: Orders

## 2017-09-02 DIAGNOSIS — K080 Exfoliation of teeth due to systemic causes: Secondary | ICD-10-CM | POA: Diagnosis not present

## 2017-11-26 DIAGNOSIS — G894 Chronic pain syndrome: Secondary | ICD-10-CM | POA: Diagnosis not present

## 2017-12-18 ENCOUNTER — Ambulatory Visit (INDEPENDENT_AMBULATORY_CARE_PROVIDER_SITE_OTHER): Payer: Federal, State, Local not specified - PPO | Admitting: Adult Health

## 2017-12-18 ENCOUNTER — Encounter: Payer: Self-pay | Admitting: Adult Health

## 2017-12-18 VITALS — BP 150/86 | Temp 98.6°F | Ht 61.0 in | Wt 194.0 lb

## 2017-12-18 DIAGNOSIS — I10 Essential (primary) hypertension: Secondary | ICD-10-CM

## 2017-12-18 DIAGNOSIS — Z0000 Encounter for general adult medical examination without abnormal findings: Secondary | ICD-10-CM | POA: Diagnosis not present

## 2017-12-18 DIAGNOSIS — K2100 Gastro-esophageal reflux disease with esophagitis, without bleeding: Secondary | ICD-10-CM

## 2017-12-18 DIAGNOSIS — Z1159 Encounter for screening for other viral diseases: Secondary | ICD-10-CM | POA: Diagnosis not present

## 2017-12-18 DIAGNOSIS — Z780 Asymptomatic menopausal state: Secondary | ICD-10-CM

## 2017-12-18 DIAGNOSIS — Z114 Encounter for screening for human immunodeficiency virus [HIV]: Secondary | ICD-10-CM | POA: Diagnosis not present

## 2017-12-18 DIAGNOSIS — J4540 Moderate persistent asthma, uncomplicated: Secondary | ICD-10-CM | POA: Diagnosis not present

## 2017-12-18 DIAGNOSIS — F5101 Primary insomnia: Secondary | ICD-10-CM

## 2017-12-18 LAB — LIPID PANEL
Cholesterol: 189 mg/dL (ref 0–200)
HDL: 55.3 mg/dL (ref >39.00)
LDL Cholesterol: 120 mg/dL — ABNORMAL HIGH (ref 0–99)
NONHDL: 133.64
TRIGLYCERIDES: 67 mg/dL (ref 0.0–149.0)
Total CHOL/HDL Ratio: 3
VLDL: 13.4 mg/dL (ref 0.0–40.0)

## 2017-12-18 LAB — BASIC METABOLIC PANEL
BUN: 21 mg/dL (ref 6–23)
CHLORIDE: 103 meq/L (ref 96–112)
CO2: 31 meq/L (ref 19–32)
Calcium: 9.6 mg/dL (ref 8.4–10.5)
Creatinine, Ser: 0.97 mg/dL (ref 0.40–1.20)
GFR: 74.8 mL/min (ref >60.00)
GLUCOSE: 100 mg/dL — AB (ref 70–99)
POTASSIUM: 4.3 meq/L (ref 3.5–5.1)
Sodium: 140 mEq/L (ref 135–145)

## 2017-12-18 LAB — CBC WITH DIFFERENTIAL/PLATELET
BASOS ABS: 0 10*3/uL (ref 0.0–0.1)
BASOS PCT: 0.4 % (ref 0.0–3.0)
EOS ABS: 0.2 10*3/uL (ref 0.0–0.7)
Eosinophils Relative: 3.2 % (ref 0.0–5.0)
HEMATOCRIT: 40.8 % (ref 36.0–46.0)
HEMOGLOBIN: 13.4 g/dL (ref 12.0–15.0)
LYMPHS PCT: 25.4 % (ref 12.0–46.0)
Lymphs Abs: 1.9 10*3/uL (ref 0.7–4.0)
MCHC: 32.8 g/dL (ref 30.0–36.0)
MCV: 81 fl (ref 78.0–100.0)
Monocytes Absolute: 0.6 10*3/uL (ref 0.1–1.0)
Monocytes Relative: 8.1 % (ref 3.0–12.0)
Neutro Abs: 4.8 10*3/uL (ref 1.4–7.7)
Neutrophils Relative %: 62.9 % (ref 43.0–77.0)
Platelets: 273 10*3/uL (ref 150.0–400.0)
RBC: 5.03 Mil/uL (ref 3.87–5.11)
RDW: 14.3 % (ref 11.5–15.5)
WBC: 7.6 10*3/uL (ref 4.0–10.5)

## 2017-12-18 LAB — HEPATIC FUNCTION PANEL
ALK PHOS: 69 U/L (ref 39–117)
ALT: 14 U/L (ref 0–35)
AST: 12 U/L (ref 0–37)
Albumin: 4.3 g/dL (ref 3.5–5.2)
Bilirubin, Direct: 0.2 mg/dL (ref 0.0–0.3)
Total Bilirubin: 0.7 mg/dL (ref 0.2–1.2)
Total Protein: 7.1 g/dL (ref 6.0–8.3)

## 2017-12-18 LAB — TSH: TSH: 1.47 u[IU]/mL (ref 0.35–4.50)

## 2017-12-18 LAB — HEMOGLOBIN A1C: HEMOGLOBIN A1C: 5.8 % (ref 4.6–6.5)

## 2017-12-18 MED ORDER — TEMAZEPAM 15 MG PO CAPS: ORAL_CAPSULE | ORAL | 2 refills | Status: DC

## 2017-12-18 MED ORDER — HYDROCHLOROTHIAZIDE 25 MG PO TABS: 25.0000 mg | ORAL_TABLET | Freq: Every day | ORAL | 3 refills | Status: DC

## 2017-12-18 MED ORDER — FLUTICASONE FUROATE-VILANTEROL 100-25 MCG/INH IN AEPB: 1.0000 | INHALATION_SPRAY | Freq: Every day | RESPIRATORY_TRACT | 11 refills | Status: DC

## 2017-12-18 MED ORDER — FELODIPINE ER 10 MG PO TB24: 10.0000 mg | ORAL_TABLET | Freq: Every day | ORAL | 3 refills | Status: DC

## 2017-12-18 MED ORDER — LISINOPRIL 40 MG PO TABS: 40.0000 mg | ORAL_TABLET | Freq: Every day | ORAL | 3 refills | Status: DC

## 2017-12-18 MED ORDER — OMEPRAZOLE 20 MG PO CPDR
20.0000 mg | DELAYED_RELEASE_CAPSULE | Freq: Two times a day (BID) | ORAL | 3 refills | Status: DC
Start: 2017-12-18 — End: 2019-01-21
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
## 2017-12-19 LAB — HEPATITIS C ANTIBODY
Hepatitis C Ab: NONREACTIVE
SIGNAL TO CUT-OFF: 0.02 (ref <1.00)

## 2017-12-19 LAB — HIV ANTIBODY (ROUTINE TESTING W REFLEX): HIV: NONREACTIVE

## 2017-12-31 ENCOUNTER — Encounter: Payer: Self-pay | Admitting: Adult Health

## 2017-12-31 ENCOUNTER — Other Ambulatory Visit: Payer: Self-pay | Admitting: Adult Health

## 2017-12-31 ENCOUNTER — Ambulatory Visit: Payer: Federal, State, Local not specified - PPO | Admitting: Adult Health

## 2017-12-31 VITALS — BP 114/70 | Temp 98.3°F | Wt 199.0 lb

## 2017-12-31 DIAGNOSIS — F32A Depression, unspecified: Secondary | ICD-10-CM

## 2017-12-31 DIAGNOSIS — F419 Anxiety disorder, unspecified: Principal | ICD-10-CM

## 2017-12-31 DIAGNOSIS — F329 Major depressive disorder, single episode, unspecified: Secondary | ICD-10-CM | POA: Diagnosis not present

## 2017-12-31 MED ORDER — CITALOPRAM HYDROBROMIDE 10 MG PO TABS: 10.0000 mg | ORAL_TABLET | Freq: Every day | ORAL | 0 refills | Status: DC

## 2017-12-31 MED ORDER — ALPRAZOLAM 0.25 MG PO TABS: 0.2500 mg | ORAL_TABLET | Freq: Three times a day (TID) | ORAL | 0 refills | Status: AC | PRN
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
## 2018-01-01 NOTE — Telephone Encounter (Signed)
Request is for a 90 days supply.  Denied.  Need to make sure the medication will be affected before order such a large amount.

## 2018-01-13 ENCOUNTER — Encounter: Payer: Self-pay | Admitting: Adult Health

## 2018-02-04 ENCOUNTER — Encounter: Payer: Self-pay | Admitting: Adult Health

## 2018-02-05 ENCOUNTER — Encounter: Payer: Self-pay | Admitting: Adult Health

## 2018-02-05 ENCOUNTER — Other Ambulatory Visit: Payer: Self-pay | Admitting: Adult Health

## 2018-02-05 ENCOUNTER — Ambulatory Visit: Payer: Federal, State, Local not specified - PPO | Admitting: Adult Health

## 2018-02-05 VITALS — BP 138/80 | Temp 98.6°F | Wt 195.0 lb

## 2018-02-05 DIAGNOSIS — F329 Major depressive disorder, single episode, unspecified: Secondary | ICD-10-CM

## 2018-02-05 DIAGNOSIS — F32A Depression, unspecified: Secondary | ICD-10-CM

## 2018-02-05 DIAGNOSIS — Z23 Encounter for immunization: Secondary | ICD-10-CM

## 2018-02-05 DIAGNOSIS — F419 Anxiety disorder, unspecified: Secondary | ICD-10-CM | POA: Diagnosis not present

## 2018-02-05 MED ORDER — SERTRALINE HCL 25 MG PO TABS: 25.0000 mg | ORAL_TABLET | Freq: Every day | ORAL | 1 refills | Status: DC
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
## 2018-02-06 ENCOUNTER — Other Ambulatory Visit: Payer: Self-pay | Admitting: Adult Health

## 2018-02-06 DIAGNOSIS — Z1231 Encounter for screening mammogram for malignant neoplasm of breast: Secondary | ICD-10-CM

## 2018-02-06 NOTE — Telephone Encounter (Signed)
Denied.  Request for 90 day supply.  Medication is on trial basis.

## 2018-03-06 ENCOUNTER — Ambulatory Visit
Admission: RE | Admit: 2018-03-06 | Discharge: 2018-03-06 | Disposition: A | Discharge status: Home / Self Care | Payer: Federal, State, Local not specified - PPO | Source: Ambulatory Visit | Attending: Adult Health | Admitting: Adult Health

## 2018-03-06 DIAGNOSIS — Z1231 Encounter for screening mammogram for malignant neoplasm of breast: Secondary | ICD-10-CM

## 2018-05-11 DIAGNOSIS — H04123 Dry eye syndrome of bilateral lacrimal glands: Secondary | ICD-10-CM | POA: Diagnosis not present

## 2018-05-11 DIAGNOSIS — H40033 Anatomical narrow angle, bilateral: Secondary | ICD-10-CM | POA: Diagnosis not present

## 2018-06-04 ENCOUNTER — Telehealth: Payer: Self-pay | Admitting: Adult Health

## 2018-06-04 DIAGNOSIS — F329 Major depressive disorder, single episode, unspecified: Secondary | ICD-10-CM

## 2018-06-04 DIAGNOSIS — F419 Anxiety disorder, unspecified: Principal | ICD-10-CM

## 2018-06-04 DIAGNOSIS — F32A Depression, unspecified: Secondary | ICD-10-CM

## 2018-06-09 NOTE — Telephone Encounter (Signed)
Will forward to Rebecca Eaton, [place]

## 2018-06-09 NOTE — Telephone Encounter (Signed)
Pt states she is needing refill on temazepam (RESTORIL) 15 MG capsule Pt states she has refills left , but pharmacy advised pt it needs a prior auth .  Please advise.  [facility] [place], [place] [facility] [phone] (Phone) [phone] (Fax)

## 2018-06-10 DIAGNOSIS — Z79899 Other long term (current) drug therapy: Secondary | ICD-10-CM | POA: Diagnosis not present

## 2018-06-10 DIAGNOSIS — M961 Postlaminectomy syndrome, not elsewhere classified: Secondary | ICD-10-CM | POA: Insufficient documentation

## 2018-06-11 NOTE — Telephone Encounter (Signed)
PA sent to cover my meds. Key: Sheena Velasquez

## 2018-06-16 NOTE — Telephone Encounter (Signed)
Checked status on cover my meds. No PA needed.

## 2018-06-29 ENCOUNTER — Other Ambulatory Visit: Payer: Self-pay | Admitting: Adult Health

## 2018-06-29 DIAGNOSIS — F5101 Primary insomnia: Secondary | ICD-10-CM

## 2018-07-21 ENCOUNTER — Telehealth: Payer: Self-pay

## 2018-07-21 NOTE — Telephone Encounter (Signed)
PA for temazepam (RESTORIL) 15 MG capsule has been sent to cover my meds.  PA HAS BEEN APPROVED.  KeyGala Murdoch - PA Case ID: [id number]

## 2018-12-18 ENCOUNTER — Encounter: Payer: Self-pay | Admitting: Adult Health

## 2018-12-18 ENCOUNTER — Other Ambulatory Visit: Payer: Self-pay | Admitting: Internal Medicine

## 2018-12-18 ENCOUNTER — Ambulatory Visit (INDEPENDENT_AMBULATORY_CARE_PROVIDER_SITE_OTHER): Payer: Federal, State, Local not specified - PPO | Admitting: Adult Health

## 2018-12-18 ENCOUNTER — Other Ambulatory Visit: Payer: Self-pay

## 2018-12-18 DIAGNOSIS — R6889 Other general symptoms and signs: Secondary | ICD-10-CM | POA: Diagnosis not present

## 2018-12-18 DIAGNOSIS — Z20828 Contact with and (suspected) exposure to other viral communicable diseases: Secondary | ICD-10-CM

## 2018-12-18 DIAGNOSIS — Z20822 Contact with and (suspected) exposure to covid-19: Secondary | ICD-10-CM
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
## 2018-12-22 ENCOUNTER — Encounter: Payer: Self-pay | Admitting: Adult Health

## 2018-12-23 LAB — NOVEL CORONAVIRUS, NAA: SARS-CoV-2, NAA: NOT DETECTED

## 2018-12-24 ENCOUNTER — Other Ambulatory Visit: Payer: Self-pay

## 2018-12-24 ENCOUNTER — Encounter: Payer: Self-pay | Admitting: Adult Health

## 2018-12-24 ENCOUNTER — Telehealth (INDEPENDENT_AMBULATORY_CARE_PROVIDER_SITE_OTHER): Payer: Federal, State, Local not specified - PPO | Admitting: Adult Health

## 2018-12-24 DIAGNOSIS — J029 Acute pharyngitis, unspecified: Secondary | ICD-10-CM | POA: Diagnosis not present

## 2018-12-24 DIAGNOSIS — J069 Acute upper respiratory infection, unspecified: Secondary | ICD-10-CM | POA: Diagnosis not present

## 2018-12-24 DIAGNOSIS — M542 Cervicalgia: Secondary | ICD-10-CM | POA: Diagnosis not present

## 2018-12-24 DIAGNOSIS — R110 Nausea: Secondary | ICD-10-CM

## 2018-12-24 MED ORDER — AZITHROMYCIN 250 MG PO TABS: ORAL_TABLET | ORAL | 0 refills | Status: DC

## 2018-12-24 MED ORDER — ONDANSETRON HCL 4 MG PO TABS: 4.0000 mg | ORAL_TABLET | Freq: Three times a day (TID) | ORAL | 0 refills | Status: DC | PRN

## 2018-12-24 NOTE — Telephone Encounter (Signed)
Pt has been scheduled for Doxy visit with [facility].  Nothing further needed.
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
## 2018-12-29 ENCOUNTER — Encounter: Payer: Self-pay | Admitting: Family Medicine

## 2019-01-10 ENCOUNTER — Other Ambulatory Visit: Payer: Self-pay | Admitting: Adult Health

## 2019-01-10 DIAGNOSIS — I10 Essential (primary) hypertension: Secondary | ICD-10-CM

## 2019-01-13 ENCOUNTER — Encounter: Payer: Self-pay | Admitting: Family Medicine

## 2019-01-13 NOTE — Telephone Encounter (Signed)
Sent to the pharmacy by e-scribe for 90 days.  Letter sent to the pt by MyChart.

## 2019-01-21 ENCOUNTER — Ambulatory Visit: Payer: Federal, State, Local not specified - PPO | Admitting: Adult Health

## 2019-01-21 ENCOUNTER — Encounter: Payer: Self-pay | Admitting: Adult Health

## 2019-01-21 VITALS — BP 118/80 | Temp 98.0°F | Ht 62.75 in | Wt 196.0 lb

## 2019-01-21 DIAGNOSIS — K2100 Gastro-esophageal reflux disease with esophagitis, without bleeding: Secondary | ICD-10-CM

## 2019-01-21 DIAGNOSIS — I10 Essential (primary) hypertension: Secondary | ICD-10-CM | POA: Diagnosis not present

## 2019-01-21 DIAGNOSIS — Z0000 Encounter for general adult medical examination without abnormal findings: Secondary | ICD-10-CM | POA: Diagnosis not present

## 2019-01-21 DIAGNOSIS — J4540 Moderate persistent asthma, uncomplicated: Secondary | ICD-10-CM

## 2019-01-21 DIAGNOSIS — Z23 Encounter for immunization: Secondary | ICD-10-CM | POA: Diagnosis not present

## 2019-01-21 DIAGNOSIS — F5101 Primary insomnia: Secondary | ICD-10-CM

## 2019-01-21 LAB — COMPREHENSIVE METABOLIC PANEL
ALT: 11 U/L (ref 0–35)
AST: 12 U/L (ref 0–37)
Albumin: 4.6 g/dL (ref 3.5–5.2)
Alkaline Phosphatase: 75 U/L (ref 39–117)
BUN: 21 mg/dL (ref 6–23)
CO2: 29 mEq/L (ref 19–32)
Calcium: 9.8 mg/dL (ref 8.4–10.5)
Chloride: 101 mEq/L (ref 96–112)
Creatinine, Ser: 0.95 mg/dL (ref 0.40–1.20)
GFR: 71.84 mL/min (ref >60.00)
Glucose, Bld: 92 mg/dL (ref 70–99)
Potassium: 4.4 mEq/L (ref 3.5–5.1)
Sodium: 139 mEq/L (ref 135–145)
Total Bilirubin: 0.6 mg/dL (ref 0.2–1.2)
Total Protein: 7.2 g/dL (ref 6.0–8.3)

## 2019-01-21 LAB — CBC WITH DIFFERENTIAL/PLATELET
Basophils Absolute: 0.1 10*3/uL (ref 0.0–0.1)
Basophils Relative: 0.8 % (ref 0.0–3.0)
Eosinophils Absolute: 0.4 10*3/uL (ref 0.0–0.7)
Eosinophils Relative: 5.2 % — ABNORMAL HIGH (ref 0.0–5.0)
HCT: 42.1 % (ref 36.0–46.0)
Hemoglobin: 13.6 g/dL (ref 12.0–15.0)
Lymphocytes Relative: 26.2 % (ref 12.0–46.0)
Lymphs Abs: 1.8 10*3/uL (ref 0.7–4.0)
MCHC: 32.3 g/dL (ref 30.0–36.0)
MCV: 81.9 fl (ref 78.0–100.0)
Monocytes Absolute: 0.6 10*3/uL (ref 0.1–1.0)
Monocytes Relative: 8.6 % (ref 3.0–12.0)
Neutro Abs: 4.2 10*3/uL (ref 1.4–7.7)
Neutrophils Relative %: 59.2 % (ref 43.0–77.0)
Platelets: 272 10*3/uL (ref 150.0–400.0)
RBC: 5.14 Mil/uL — ABNORMAL HIGH (ref 3.87–5.11)
RDW: 14.7 % (ref 11.5–15.5)
WBC: 7 10*3/uL (ref 4.0–10.5)

## 2019-01-21 LAB — LIPID PANEL
Cholesterol: 210 mg/dL — ABNORMAL HIGH (ref 0–200)
HDL: 62.4 mg/dL (ref >39.00)
LDL Cholesterol: 136 mg/dL — ABNORMAL HIGH (ref 0–99)
NonHDL: 147.49
Total CHOL/HDL Ratio: 3
Triglycerides: 58 mg/dL (ref 0.0–149.0)
VLDL: 11.6 mg/dL (ref 0.0–40.0)

## 2019-01-21 LAB — TSH: TSH: 2.03 u[IU]/mL (ref 0.35–4.50)

## 2019-01-21 MED ORDER — BREO ELLIPTA 100-25 MCG/INH IN AEPB: 1.0000 | INHALATION_SPRAY | Freq: Every day | RESPIRATORY_TRACT | 3 refills | Status: DC

## 2019-01-21 MED ORDER — HYDROCHLOROTHIAZIDE 25 MG PO TABS: 25.0000 mg | ORAL_TABLET | Freq: Every day | ORAL | 3 refills | Status: DC

## 2019-01-21 MED ORDER — TEMAZEPAM 15 MG PO CAPS: 15.0000 mg | ORAL_CAPSULE | Freq: Every evening | ORAL | 2 refills | Status: DC | PRN

## 2019-01-21 MED ORDER — FELODIPINE ER 10 MG PO TB24: 10.0000 mg | ORAL_TABLET | Freq: Every day | ORAL | 3 refills | Status: DC

## 2019-01-21 MED ORDER — ALBUTEROL SULFATE HFA 108 (90 BASE) MCG/ACT IN AERS: 2.0000 | INHALATION_SPRAY | RESPIRATORY_TRACT | 3 refills | Status: DC | PRN

## 2019-01-21 MED ORDER — LISINOPRIL 40 MG PO TABS: 40.0000 mg | ORAL_TABLET | Freq: Every day | ORAL | 3 refills | Status: DC

## 2019-01-21 MED ORDER — OMEPRAZOLE 20 MG PO CPDR: 20.0000 mg | DELAYED_RELEASE_CAPSULE | Freq: Two times a day (BID) | ORAL | 3 refills | Status: DC

## 2019-01-21 NOTE — Addendum Note (Signed)
Addended by: Miles Costain T on: 01/21/2019 11:24 AM   Modules accepted: Orders
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
## 2019-01-22 ENCOUNTER — Other Ambulatory Visit: Payer: Self-pay | Admitting: Family Medicine

## 2019-01-22 MED ORDER — SIMVASTATIN 10 MG PO TABS: 10.0000 mg | ORAL_TABLET | Freq: Every day | ORAL | 3 refills | Status: DC

## 2019-02-11 DIAGNOSIS — H04123 Dry eye syndrome of bilateral lacrimal glands: Secondary | ICD-10-CM | POA: Diagnosis not present

## 2019-02-11 DIAGNOSIS — H40033 Anatomical narrow angle, bilateral: Secondary | ICD-10-CM | POA: Diagnosis not present

## 2019-07-13 DIAGNOSIS — M25511 Pain in right shoulder: Secondary | ICD-10-CM | POA: Diagnosis not present

## 2019-07-13 DIAGNOSIS — G894 Chronic pain syndrome: Secondary | ICD-10-CM | POA: Diagnosis not present

## 2019-07-20 ENCOUNTER — Other Ambulatory Visit: Payer: Self-pay | Admitting: Chiropractic Medicine

## 2019-07-20 DIAGNOSIS — M25511 Pain in right shoulder: Secondary | ICD-10-CM

## 2019-07-26 ENCOUNTER — Other Ambulatory Visit: Payer: Self-pay

## 2019-07-26 ENCOUNTER — Ambulatory Visit
Admission: RE | Admit: 2019-07-26 | Discharge: 2019-07-26 | Disposition: A | Discharge status: Home / Self Care | Payer: Federal, State, Local not specified - PPO | Source: Ambulatory Visit | Attending: Chiropractic Medicine | Admitting: Chiropractic Medicine

## 2019-07-26 DIAGNOSIS — M19011 Primary osteoarthritis, right shoulder: Secondary | ICD-10-CM | POA: Diagnosis not present

## 2019-07-26 DIAGNOSIS — M25511 Pain in right shoulder: Secondary | ICD-10-CM

## 2019-08-25 ENCOUNTER — Other Ambulatory Visit: Payer: Federal, State, Local not specified - PPO

## 2019-08-25 ENCOUNTER — Ambulatory Visit: Payer: Federal, State, Local not specified - PPO | Attending: Internal Medicine

## 2019-08-25 DIAGNOSIS — Z20822 Contact with and (suspected) exposure to covid-19: Secondary | ICD-10-CM

## 2019-08-26 LAB — SARS-COV-2, NAA 2 DAY TAT

## 2019-08-26 LAB — NOVEL CORONAVIRUS, NAA: SARS-CoV-2, NAA: NOT DETECTED

## 2019-09-07 DIAGNOSIS — M25511 Pain in right shoulder: Secondary | ICD-10-CM | POA: Insufficient documentation

## 2019-09-07 DIAGNOSIS — M19011 Primary osteoarthritis, right shoulder: Secondary | ICD-10-CM | POA: Insufficient documentation

## 2019-09-10 DIAGNOSIS — M25511 Pain in right shoulder: Secondary | ICD-10-CM | POA: Diagnosis not present

## 2019-09-10 DIAGNOSIS — M19011 Primary osteoarthritis, right shoulder: Secondary | ICD-10-CM | POA: Diagnosis not present

## 2019-11-26 ENCOUNTER — Other Ambulatory Visit: Payer: Self-pay

## 2019-11-26 ENCOUNTER — Telehealth: Payer: Self-pay

## 2019-11-26 ENCOUNTER — Telehealth: Payer: Federal, State, Local not specified - PPO | Admitting: Family Medicine

## 2019-11-26 ENCOUNTER — Emergency Department (HOSPITAL_COMMUNITY)
Admission: EM | Admit: 2019-11-26 | Discharge: 2019-11-26 | Disposition: A | Discharge status: Home / Self Care | Payer: Federal, State, Local not specified - PPO | Attending: Emergency Medicine | Admitting: Emergency Medicine

## 2019-11-26 ENCOUNTER — Encounter (HOSPITAL_COMMUNITY): Payer: Self-pay

## 2019-11-26 DIAGNOSIS — J45901 Unspecified asthma with (acute) exacerbation: Secondary | ICD-10-CM | POA: Diagnosis not present

## 2019-11-26 DIAGNOSIS — B349 Viral infection, unspecified: Secondary | ICD-10-CM | POA: Diagnosis not present

## 2019-11-26 DIAGNOSIS — R05 Cough: Secondary | ICD-10-CM | POA: Diagnosis not present

## 2019-11-26 DIAGNOSIS — Z79899 Other long term (current) drug therapy: Secondary | ICD-10-CM | POA: Insufficient documentation

## 2019-11-26 DIAGNOSIS — Z87891 Personal history of nicotine dependence: Secondary | ICD-10-CM | POA: Insufficient documentation

## 2019-11-26 MED ORDER — PREDNISONE 50 MG PO TABS
50.0000 mg | ORAL_TABLET | Freq: Every day | ORAL | 0 refills | Status: DC
Start: 2019-11-20 — End: 2019-12-02

## 2019-11-26 MED ORDER — IPRATROPIUM BROMIDE HFA 17 MCG/ACT IN AERS
2.0000 | INHALATION_SPRAY | Freq: Once | RESPIRATORY_TRACT | Status: AC
  Administered 2019-11-26: 2 via RESPIRATORY_TRACT
  Filled 2019-11-26: qty 12.9

## 2019-11-26 MED ORDER — ALBUTEROL SULFATE HFA 108 (90 BASE) MCG/ACT IN AERS
6.0000 | INHALATION_SPRAY | Freq: Once | RESPIRATORY_TRACT | Status: AC
  Administered 2019-11-26: 6 via RESPIRATORY_TRACT
  Filled 2019-11-26: qty 6.7

## 2019-11-26 MED ORDER — PROMETHAZINE-DM 6.25-15 MG/5ML PO SYRP
5.0000 mL | ORAL_SOLUTION | Freq: Four times a day (QID) | ORAL | 0 refills | Status: DC | PRN
Start: 2019-11-26 — End: 2020-06-09

## 2019-11-26 MED ORDER — BENZONATATE 100 MG PO CAPS
100.0000 mg | ORAL_CAPSULE | Freq: Three times a day (TID) | ORAL | 0 refills | Status: DC
Start: 2019-11-26 — End: 2020-06-09

## 2019-11-26 MED ORDER — ALBUTEROL SULFATE (2.5 MG/3ML) 0.083% IN NEBU
5.0000 mg | INHALATION_SOLUTION | Freq: Once | RESPIRATORY_TRACT | Status: DC
  Filled 2019-11-26: qty 6

## 2019-11-26 MED ORDER — PREDNISONE 20 MG PO TABS
60.0000 mg | ORAL_TABLET | Freq: Once | ORAL | Status: AC
  Administered 2019-11-26: 60 mg via ORAL
  Filled 2019-11-26: qty 3
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
## 2019-11-26 NOTE — Discharge Instructions (Signed)
Take steroids as prescribed starting tomorrow.  Take entire course. Use your albuterol inhaler every 4 hours for the next 2 days.  After this, use as needed for shortness of breath, chest tightness, wheezing, or coughing fits. Use the Tessalon Perles to help prevent cough.  Use the cough syrup as needed.  This make you tired, I recommend you take it at night before bed. Make sure stay well-hydrated water. Try to avoid things that may cause cough such as increased activity or being around smoke. Follow-up with your primary care doctor if your symptoms not improving. You may have a persistent cough for the next several weeks, as well as your symptoms are not worsening, discussed the expected. Return to the emergency room if you develop high fevers, increased difficulty breathing, severe chest pain, or any new, worsening, or concerning symptoms.

## 2019-11-26 NOTE — Progress Notes (Signed)
RT called to ED for breathing treatment. RT explained to RN per policy we cannot administer a nebulizer treatment without a recent COVID-19 test and would need to be changed to an inhaler or wait for COVID test to come back.

## 2019-11-26 NOTE — Telephone Encounter (Signed)
Spoke with pt regarding her appointment with Dr Volanda Napoleon this afternoon, pt state that she called the office and was triaged this morning and she was advised to go to the ED, pt state that she just had got back from the ED and that she was treated for Asthma, notified Dr Volanda Napoleon

## 2019-11-26 NOTE — ED Triage Notes (Signed)
patient states she has been having a cough x 1 week. patient states she has been taking cough medication OTC with no relief. Patient states she called her PCP. Patient states she has been using her albuterol inhaler with no relief.

## 2019-12-02 ENCOUNTER — Encounter: Payer: Self-pay | Admitting: Adult Health

## 2019-12-02 ENCOUNTER — Telehealth (INDEPENDENT_AMBULATORY_CARE_PROVIDER_SITE_OTHER): Payer: Federal, State, Local not specified - PPO | Admitting: Adult Health

## 2019-12-02 VITALS — Temp 97.6°F | Wt 201.0 lb

## 2019-12-02 DIAGNOSIS — J4541 Moderate persistent asthma with (acute) exacerbation: Secondary | ICD-10-CM | POA: Diagnosis not present

## 2019-12-02 MED ORDER — AZITHROMYCIN 250 MG PO TABS: ORAL_TABLET | ORAL | 0 refills | Status: DC

## 2019-12-02 MED ORDER — PREDNISONE 10 MG PO TABS: ORAL_TABLET | ORAL | 0 refills | Status: DC
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
## 2019-12-06 ENCOUNTER — Encounter: Payer: Self-pay | Admitting: Adult Health

## 2019-12-07 ENCOUNTER — Encounter: Payer: Self-pay | Admitting: Adult Health

## 2020-01-28 ENCOUNTER — Other Ambulatory Visit: Payer: Self-pay | Admitting: Adult Health

## 2020-01-28 DIAGNOSIS — I10 Essential (primary) hypertension: Secondary | ICD-10-CM

## 2020-01-28 DIAGNOSIS — K2100 Gastro-esophageal reflux disease with esophagitis, without bleeding: Secondary | ICD-10-CM

## 2020-03-07 DIAGNOSIS — M25511 Pain in right shoulder: Secondary | ICD-10-CM | POA: Diagnosis not present

## 2020-03-16 ENCOUNTER — Other Ambulatory Visit: Payer: Self-pay | Admitting: Adult Health

## 2020-03-16 DIAGNOSIS — F5101 Primary insomnia: Secondary | ICD-10-CM

## 2020-04-17 ENCOUNTER — Other Ambulatory Visit: Payer: Self-pay | Admitting: Adult Health

## 2020-04-17 DIAGNOSIS — J4540 Moderate persistent asthma, uncomplicated: Secondary | ICD-10-CM

## 2020-04-21 DIAGNOSIS — G894 Chronic pain syndrome: Secondary | ICD-10-CM | POA: Diagnosis not present

## 2020-05-30 DIAGNOSIS — Z01818 Encounter for other preprocedural examination: Secondary | ICD-10-CM | POA: Diagnosis not present

## 2020-06-08 ENCOUNTER — Other Ambulatory Visit: Payer: Self-pay

## 2020-06-09 ENCOUNTER — Ambulatory Visit (INDEPENDENT_AMBULATORY_CARE_PROVIDER_SITE_OTHER): Payer: Federal, State, Local not specified - PPO

## 2020-06-09 ENCOUNTER — Encounter: Payer: Self-pay | Admitting: Adult Health

## 2020-06-09 ENCOUNTER — Ambulatory Visit: Payer: Federal, State, Local not specified - PPO | Admitting: Adult Health

## 2020-06-09 VITALS — BP 126/80 | Temp 97.5°F | Wt 203.0 lb

## 2020-06-09 DIAGNOSIS — Z01818 Encounter for other preprocedural examination: Secondary | ICD-10-CM
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
## 2020-06-12 DIAGNOSIS — Z1152 Encounter for screening for COVID-19: Secondary | ICD-10-CM | POA: Diagnosis not present

## 2020-06-27 ENCOUNTER — Encounter: Payer: Self-pay | Admitting: Adult Health

## 2020-08-01 DIAGNOSIS — Z96611 Presence of right artificial shoulder joint: Secondary | ICD-10-CM | POA: Insufficient documentation

## 2020-08-01 DIAGNOSIS — Z471 Aftercare following joint replacement surgery: Secondary | ICD-10-CM | POA: Diagnosis not present

## 2020-08-01 DIAGNOSIS — M19011 Primary osteoarthritis, right shoulder: Secondary | ICD-10-CM | POA: Diagnosis not present

## 2020-08-01 DIAGNOSIS — G8918 Other acute postprocedural pain: Secondary | ICD-10-CM | POA: Diagnosis not present

## 2020-08-01 DIAGNOSIS — M25511 Pain in right shoulder: Secondary | ICD-10-CM | POA: Diagnosis not present

## 2020-08-04 DIAGNOSIS — Z96611 Presence of right artificial shoulder joint: Secondary | ICD-10-CM | POA: Diagnosis not present

## 2020-08-04 DIAGNOSIS — M25511 Pain in right shoulder: Secondary | ICD-10-CM | POA: Diagnosis not present

## 2020-08-04 DIAGNOSIS — M19011 Primary osteoarthritis, right shoulder: Secondary | ICD-10-CM | POA: Diagnosis not present

## 2020-08-04 DIAGNOSIS — Z471 Aftercare following joint replacement surgery: Secondary | ICD-10-CM | POA: Diagnosis not present

## 2020-08-14 DIAGNOSIS — Z471 Aftercare following joint replacement surgery: Secondary | ICD-10-CM | POA: Diagnosis not present

## 2020-08-14 DIAGNOSIS — Z96611 Presence of right artificial shoulder joint: Secondary | ICD-10-CM | POA: Diagnosis not present

## 2020-08-17 DIAGNOSIS — M25511 Pain in right shoulder: Secondary | ICD-10-CM | POA: Diagnosis not present

## 2020-08-22 DIAGNOSIS — M25511 Pain in right shoulder: Secondary | ICD-10-CM | POA: Diagnosis not present

## 2020-08-30 DIAGNOSIS — M25511 Pain in right shoulder: Secondary | ICD-10-CM | POA: Diagnosis not present

## 2020-09-07 DIAGNOSIS — M25511 Pain in right shoulder: Secondary | ICD-10-CM | POA: Diagnosis not present

## 2020-09-08 ENCOUNTER — Ambulatory Visit: Payer: Federal, State, Local not specified - PPO | Admitting: Adult Health

## 2020-09-11 DIAGNOSIS — Z96611 Presence of right artificial shoulder joint: Secondary | ICD-10-CM | POA: Diagnosis not present

## 2020-09-22 DIAGNOSIS — M25511 Pain in right shoulder: Secondary | ICD-10-CM | POA: Diagnosis not present

## 2020-09-29 DIAGNOSIS — M25511 Pain in right shoulder: Secondary | ICD-10-CM | POA: Diagnosis not present

## 2020-10-02 ENCOUNTER — Emergency Department (HOSPITAL_COMMUNITY)
Admission: EM | Admit: 2020-10-02 | Discharge: 2020-10-03 | Disposition: A | Discharge status: Home / Self Care | Payer: Medicare Other | Attending: Emergency Medicine | Admitting: Emergency Medicine

## 2020-10-02 ENCOUNTER — Encounter (HOSPITAL_COMMUNITY): Payer: Self-pay | Admitting: Emergency Medicine

## 2020-10-02 ENCOUNTER — Other Ambulatory Visit: Payer: Self-pay

## 2020-10-02 DIAGNOSIS — J45909 Unspecified asthma, uncomplicated: Secondary | ICD-10-CM | POA: Diagnosis not present

## 2020-10-02 DIAGNOSIS — M546 Pain in thoracic spine: Secondary | ICD-10-CM | POA: Diagnosis not present

## 2020-10-02 DIAGNOSIS — I10 Essential (primary) hypertension: Secondary | ICD-10-CM | POA: Insufficient documentation

## 2020-10-02 DIAGNOSIS — Z7952 Long term (current) use of systemic steroids: Secondary | ICD-10-CM | POA: Diagnosis not present

## 2020-10-02 DIAGNOSIS — Z79899 Other long term (current) drug therapy: Secondary | ICD-10-CM | POA: Diagnosis not present

## 2020-10-02 DIAGNOSIS — Z87891 Personal history of nicotine dependence: Secondary | ICD-10-CM | POA: Diagnosis not present

## 2020-10-02 LAB — URINALYSIS, ROUTINE W REFLEX MICROSCOPIC
Bilirubin Urine: NEGATIVE
Glucose, UA: NEGATIVE mg/dL
Hgb urine dipstick: NEGATIVE
Ketones, ur: NEGATIVE mg/dL
Leukocytes,Ua: NEGATIVE
Nitrite: NEGATIVE
Protein, ur: NEGATIVE mg/dL
Specific Gravity, Urine: 1.019 (ref 1.005–1.030)
pH: 5 (ref 5.0–8.0)

## 2020-10-02 MED ORDER — OXYCODONE-ACETAMINOPHEN 5-325 MG PO TABS: 1.0000 | ORAL_TABLET | Freq: Four times a day (QID) | ORAL | 0 refills | Status: DC | PRN

## 2020-10-02 MED ORDER — OXYCODONE-ACETAMINOPHEN 5-325 MG PO TABS
2.0000 | ORAL_TABLET | Freq: Once | ORAL | Status: AC
  Administered 2020-10-02: 2 via ORAL
  Filled 2020-10-02: qty 2

## 2020-10-02 NOTE — ED Triage Notes (Signed)
Pt arrived via POV. Pt states she has lower left sided back pain that wraps around to her stomach. Pt states the pain began around Friday. Pt states she took tramadol around 4pm and she has had no relief.

## 2020-10-02 NOTE — Discharge Instructions (Addendum)
1.  Follow-up with Dr. Nelva Bush for recheck of your back pain. 2.  At this time the back pain appears to be musculoskeletal. 3.  Return to the emergency department if you develop any chest pain, shortness of breath, fever, cough, pain or burning with urination, weakness numbness tingling to the legs or other concerning symptoms.
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
## 2020-10-05 ENCOUNTER — Other Ambulatory Visit: Payer: Self-pay

## 2020-10-06 ENCOUNTER — Ambulatory Visit: Payer: Medicare Other | Admitting: Adult Health

## 2020-10-06 ENCOUNTER — Encounter: Payer: Self-pay | Admitting: Adult Health

## 2020-10-06 VITALS — BP 118/90 | HR 102 | Temp 98.3°F | Ht 62.0 in | Wt 206.4 lb

## 2020-10-06 DIAGNOSIS — M546 Pain in thoracic spine: Secondary | ICD-10-CM
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
## 2020-10-19 DIAGNOSIS — Z5189 Encounter for other specified aftercare: Secondary | ICD-10-CM | POA: Insufficient documentation

## 2021-01-23 ENCOUNTER — Other Ambulatory Visit: Payer: Self-pay | Admitting: Adult Health

## 2021-01-23 DIAGNOSIS — J4541 Moderate persistent asthma with (acute) exacerbation: Secondary | ICD-10-CM

## 2021-04-10 ENCOUNTER — Other Ambulatory Visit: Payer: Self-pay | Admitting: Adult Health

## 2021-04-10 DIAGNOSIS — K2100 Gastro-esophageal reflux disease with esophagitis, without bleeding: Secondary | ICD-10-CM

## 2021-04-10 DIAGNOSIS — I10 Essential (primary) hypertension: Secondary | ICD-10-CM

## 2021-04-11 NOTE — Telephone Encounter (Signed)
Pt needs a CPE

## 2021-04-12 NOTE — Telephone Encounter (Signed)
L/m for pt to return call

## 2021-08-10 ENCOUNTER — Telehealth: Payer: Self-pay

## 2021-08-10 NOTE — Telephone Encounter (Signed)
Last OV with PCP on 10/06/20 for acute reasons. Pt needs annual visit with PCP & AWV scheduled.  ?LVM instructions for pt to call back to schedule. ?

## 2021-09-19 ENCOUNTER — Ambulatory Visit (INDEPENDENT_AMBULATORY_CARE_PROVIDER_SITE_OTHER): Payer: Medicare Other | Admitting: Adult Health

## 2021-09-19 ENCOUNTER — Encounter: Payer: Self-pay | Admitting: Adult Health

## 2021-09-19 VITALS — BP 140/100 | HR 84 | Temp 97.8°F | Ht 62.0 in | Wt 221.0 lb

## 2021-09-19 DIAGNOSIS — F5101 Primary insomnia: Secondary | ICD-10-CM

## 2021-09-19 DIAGNOSIS — K2100 Gastro-esophageal reflux disease with esophagitis, without bleeding: Secondary | ICD-10-CM | POA: Diagnosis not present

## 2021-09-19 DIAGNOSIS — I10 Essential (primary) hypertension: Secondary | ICD-10-CM | POA: Diagnosis not present

## 2021-09-19 DIAGNOSIS — Z6841 Body Mass Index (BMI) 40.0 and over, adult: Secondary | ICD-10-CM

## 2021-09-19 DIAGNOSIS — J4540 Moderate persistent asthma, uncomplicated: Secondary | ICD-10-CM | POA: Diagnosis not present

## 2021-09-19 DIAGNOSIS — Z0000 Encounter for general adult medical examination without abnormal findings: Secondary | ICD-10-CM | POA: Diagnosis not present

## 2021-09-19 DIAGNOSIS — E782 Mixed hyperlipidemia: Secondary | ICD-10-CM | POA: Diagnosis not present

## 2021-09-19 LAB — LIPID PANEL
Cholesterol: 193 mg/dL (ref 0–200)
HDL: 70.6 mg/dL (ref >39.00)
LDL Cholesterol: 111 mg/dL — ABNORMAL HIGH (ref 0–99)
NonHDL: 122.77
Total CHOL/HDL Ratio: 3
Triglycerides: 61 mg/dL (ref 0.0–149.0)
VLDL: 12.2 mg/dL (ref 0.0–40.0)

## 2021-09-19 LAB — CBC WITH DIFFERENTIAL/PLATELET
Basophils Absolute: 0.1 10*3/uL (ref 0.0–0.1)
Basophils Relative: 0.7 % (ref 0.0–3.0)
Eosinophils Absolute: 0.3 10*3/uL (ref 0.0–0.7)
Eosinophils Relative: 3.7 % (ref 0.0–5.0)
HCT: 42.1 % (ref 36.0–46.0)
Hemoglobin: 13.5 g/dL (ref 12.0–15.0)
Lymphocytes Relative: 21.2 % (ref 12.0–46.0)
Lymphs Abs: 1.7 10*3/uL (ref 0.7–4.0)
MCHC: 32.1 g/dL (ref 30.0–36.0)
MCV: 79.4 fl (ref 78.0–100.0)
Monocytes Absolute: 0.6 10*3/uL (ref 0.1–1.0)
Monocytes Relative: 6.9 % (ref 3.0–12.0)
Neutro Abs: 5.4 10*3/uL (ref 1.4–7.7)
Neutrophils Relative %: 67.5 % (ref 43.0–77.0)
Platelets: 255 10*3/uL (ref 150.0–400.0)
RBC: 5.31 Mil/uL — ABNORMAL HIGH (ref 3.87–5.11)
RDW: 16.3 % — ABNORMAL HIGH (ref 11.5–15.5)
WBC: 8.1 10*3/uL (ref 4.0–10.5)

## 2021-09-19 LAB — TSH: TSH: 2.21 u[IU]/mL (ref 0.35–5.50)

## 2021-09-19 LAB — COMPREHENSIVE METABOLIC PANEL
ALT: 17 U/L (ref 0–35)
AST: 19 U/L (ref 0–37)
Albumin: 4.2 g/dL (ref 3.5–5.2)
Alkaline Phosphatase: 85 U/L (ref 39–117)
BUN: 17 mg/dL (ref 6–23)
CO2: 28 mEq/L (ref 19–32)
Calcium: 9.4 mg/dL (ref 8.4–10.5)
Chloride: 106 mEq/L (ref 96–112)
Creatinine, Ser: 1.04 mg/dL (ref 0.40–1.20)
GFR: 56.25 mL/min — ABNORMAL LOW (ref >60.00)
Glucose, Bld: 97 mg/dL (ref 70–99)
Potassium: 4.2 mEq/L (ref 3.5–5.1)
Sodium: 141 mEq/L (ref 135–145)
Total Bilirubin: 0.6 mg/dL (ref 0.2–1.2)
Total Protein: 7.3 g/dL (ref 6.0–8.3)

## 2021-09-19 LAB — HEMOGLOBIN A1C: Hgb A1c MFr Bld: 6.2 % (ref 4.6–6.5)

## 2021-09-19 MED ORDER — HYDROCHLOROTHIAZIDE 25 MG PO TABS: ORAL_TABLET | ORAL | 3 refills | Status: DC

## 2021-09-19 MED ORDER — SIMVASTATIN 10 MG PO TABS: 10.0000 mg | ORAL_TABLET | Freq: Every day | ORAL | 3 refills | Status: DC

## 2021-09-19 MED ORDER — OMEPRAZOLE 20 MG PO CPDR: DELAYED_RELEASE_CAPSULE | ORAL | 3 refills | Status: DC

## 2021-09-19 MED ORDER — LISINOPRIL 40 MG PO TABS: ORAL_TABLET | ORAL | 3 refills | Status: DC

## 2021-09-19 MED ORDER — ALBUTEROL SULFATE HFA 108 (90 BASE) MCG/ACT IN AERS: INHALATION_SPRAY | RESPIRATORY_TRACT | 2 refills | Status: DC

## 2021-09-19 MED ORDER — FLUTICASONE FUROATE-VILANTEROL 100-25 MCG/ACT IN AEPB: 1.0000 | INHALATION_SPRAY | Freq: Every day | RESPIRATORY_TRACT | 6 refills | Status: DC

## 2021-09-19 MED ORDER — TEMAZEPAM 15 MG PO CAPS: ORAL_CAPSULE | ORAL | 2 refills | Status: DC

## 2021-09-19 MED ORDER — FELODIPINE ER 10 MG PO TB24: ORAL_TABLET | ORAL | 3 refills | Status: DC
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
## 2021-09-20 ENCOUNTER — Encounter: Payer: Self-pay | Admitting: Adult Health

## 2021-09-21 ENCOUNTER — Encounter: Payer: Self-pay | Admitting: Adult Health

## 2021-09-21 ENCOUNTER — Other Ambulatory Visit: Payer: Self-pay | Admitting: Adult Health

## 2021-09-21 DIAGNOSIS — R7303 Prediabetes: Secondary | ICD-10-CM

## 2021-09-21 MED ORDER — TRULICITY 0.75 MG/0.5ML ~~LOC~~ SOAJ: 0.7500 mg | SUBCUTANEOUS | 2 refills | Status: DC

## 2021-09-21 NOTE — Telephone Encounter (Signed)
Please advise 

## 2021-10-02 ENCOUNTER — Other Ambulatory Visit: Payer: Self-pay

## 2021-10-02 DIAGNOSIS — E2839 Other primary ovarian failure: Secondary | ICD-10-CM

## 2021-10-04 ENCOUNTER — Other Ambulatory Visit: Payer: Self-pay | Admitting: Adult Health

## 2021-10-04 DIAGNOSIS — R5381 Other malaise: Secondary | ICD-10-CM

## 2021-10-08 ENCOUNTER — Telehealth: Payer: Self-pay | Admitting: Adult Health

## 2021-10-08 NOTE — Telephone Encounter (Signed)
Order [id number] for bone density is missing the diagnosis code, oer provider patient cannot be scheduled without it ?

## 2021-10-08 NOTE — Telephone Encounter (Signed)
Reordered with dx code. ?

## 2021-10-16 ENCOUNTER — Other Ambulatory Visit: Payer: Self-pay | Admitting: Adult Health

## 2021-10-16 DIAGNOSIS — Z1231 Encounter for screening mammogram for malignant neoplasm of breast: Secondary | ICD-10-CM

## 2021-10-17 ENCOUNTER — Telehealth: Payer: Self-pay | Admitting: Adult Health

## 2021-10-17 NOTE — Telephone Encounter (Signed)
Left message for patient to call back and schedule Medicare Annual Wellness Visit (AWV) either virtually or in office. Left  my jabber number [phone]   awvi 10/01/21 per [facility] ; please schedule at anytime with [facility] Nurse Health Advisor 1 or 2   This should be a 45 minute visit.  

## 2021-10-24 IMAGING — DX DG CHEST 2V
2 series · 2 of 2 positions shown · non-contrast
Comparison: Radiograph 02/17/2008

CLINICAL DATA: Preoperative clearance.

EXAM:
CHEST - 2 VIEW

[chest pa]
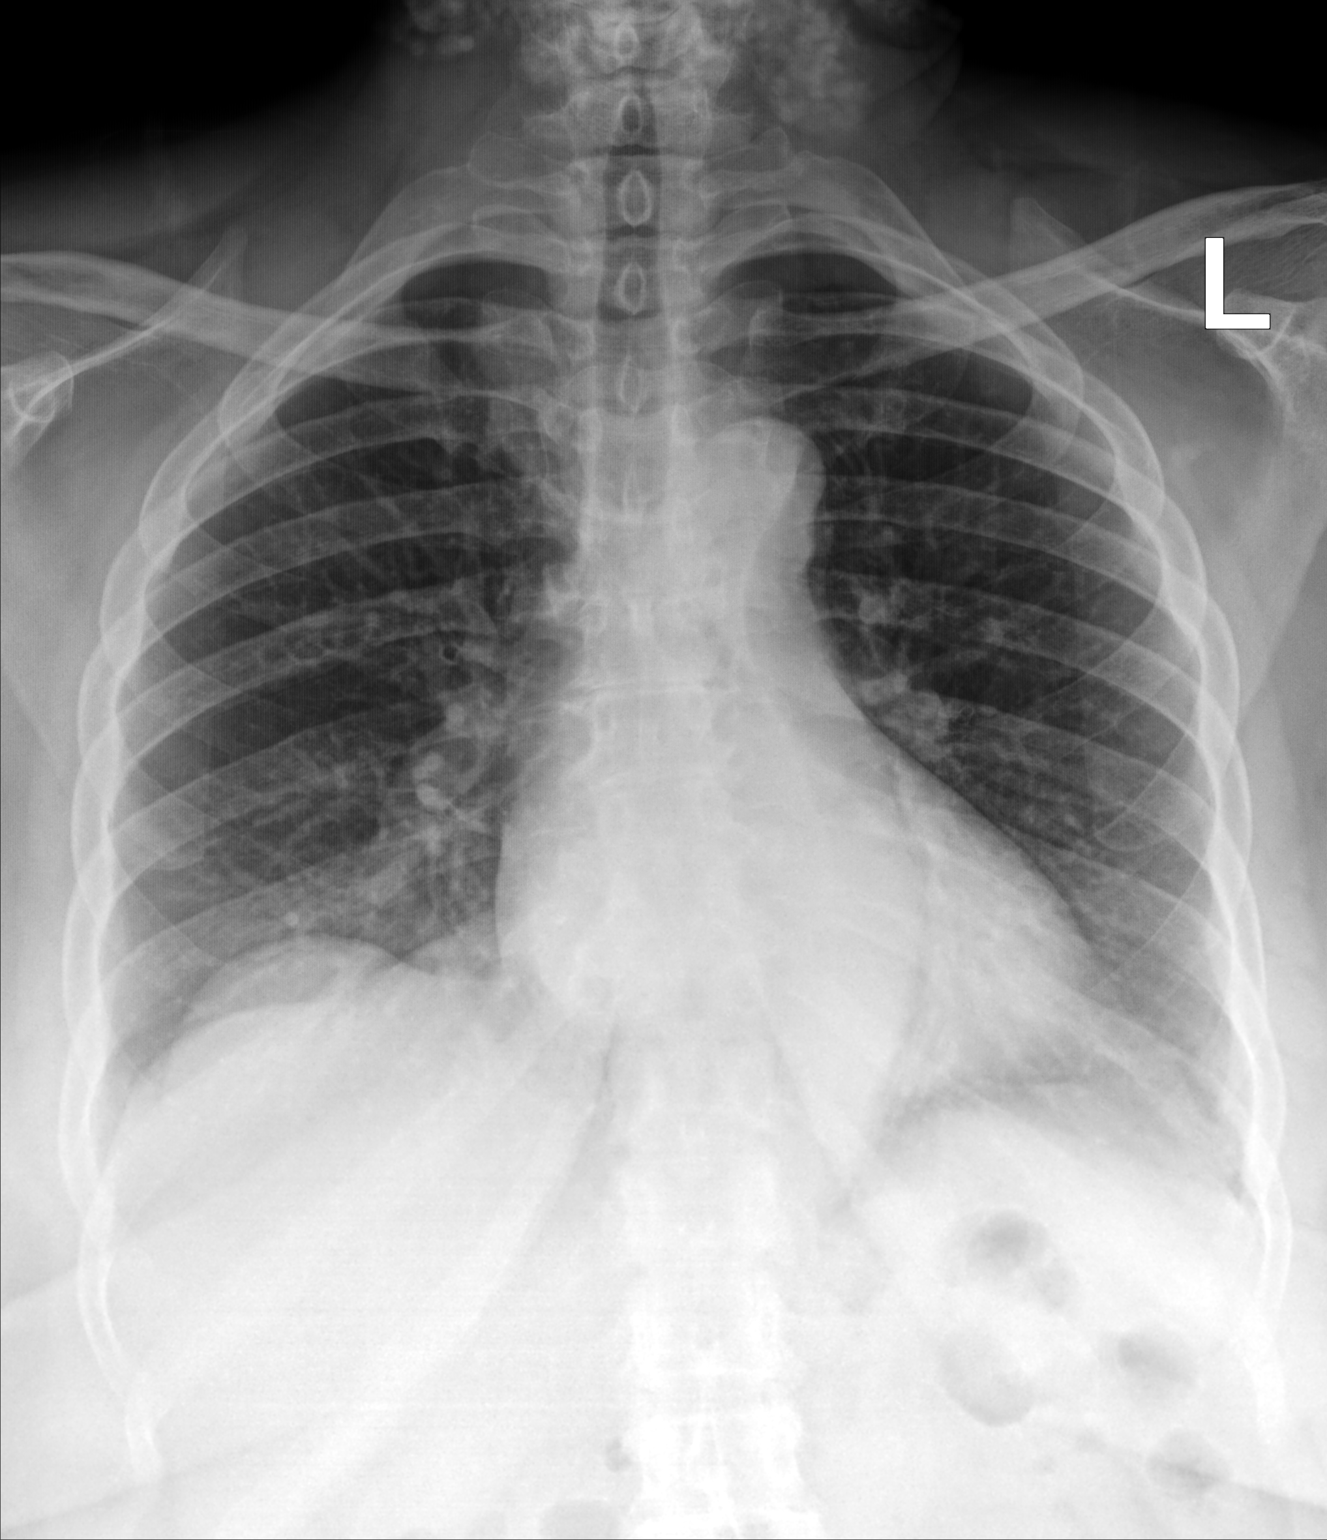

[chest lat]
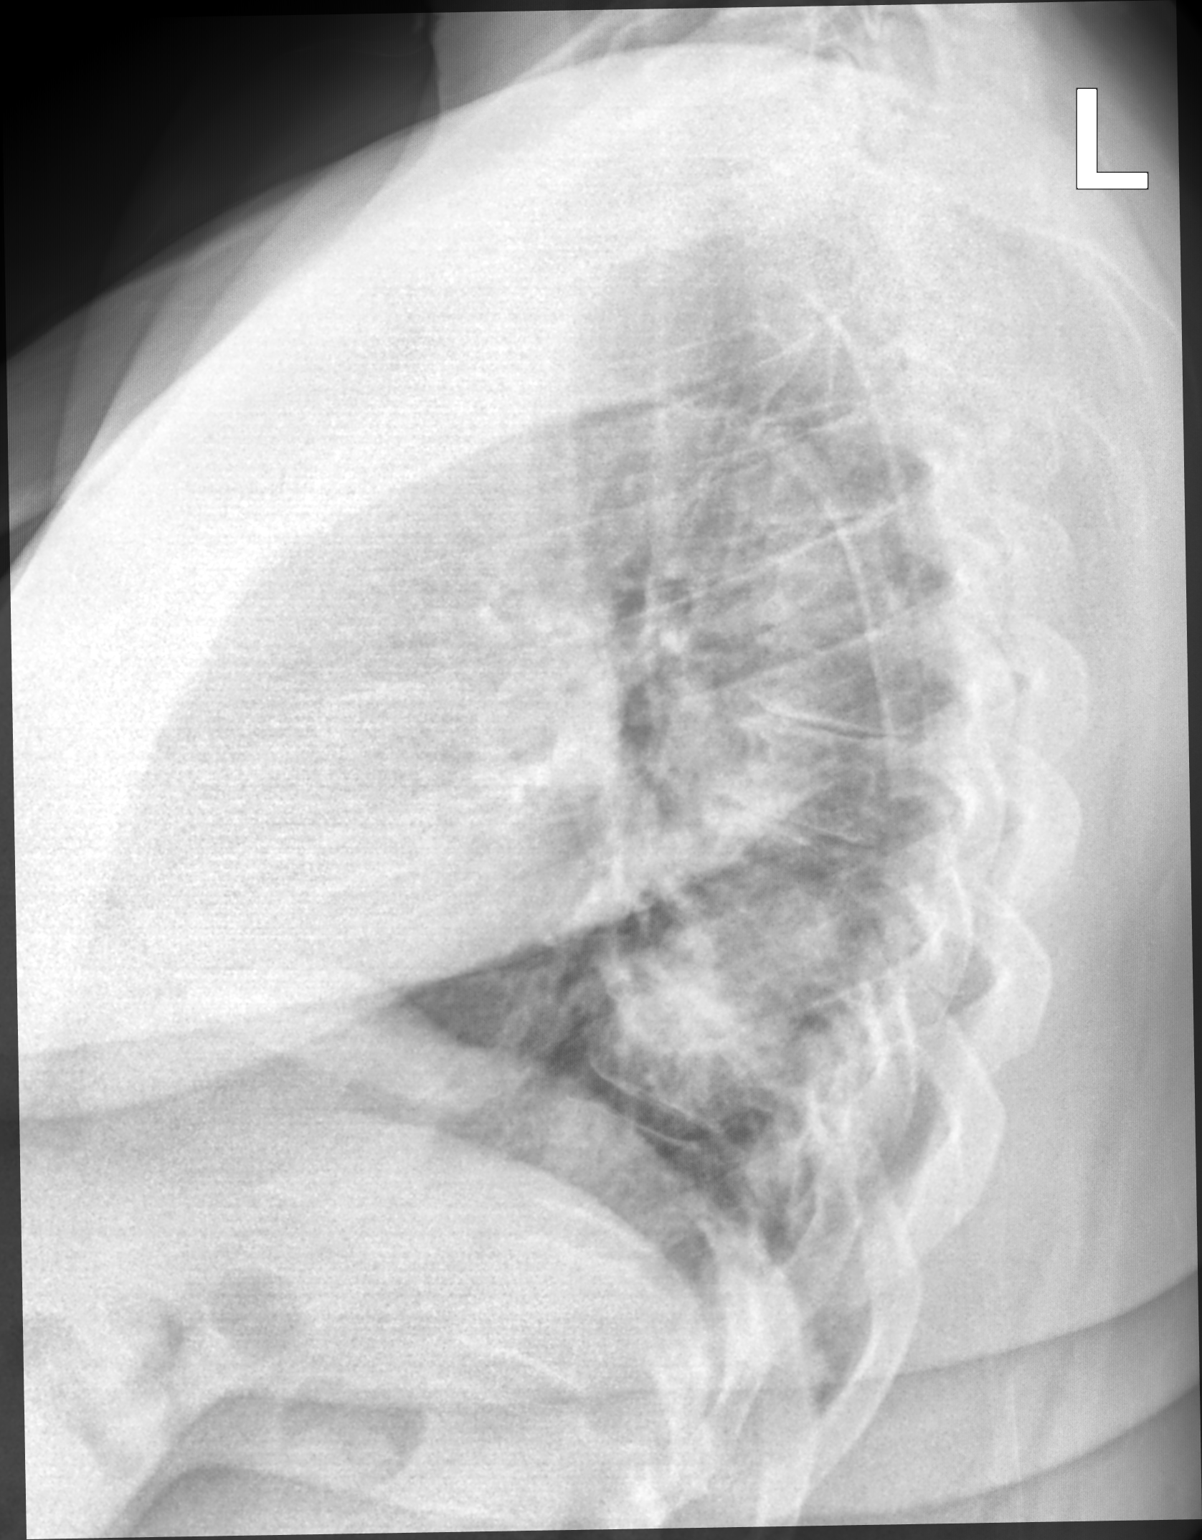

[2 of 2 positions shown; findings below may reference images not displayed]

FINDINGS: The cardiomediastinal contours are normal. The lungs are clear.
Pulmonary vasculature is normal. No consolidation, pleural effusion,
or pneumothorax. No acute osseous abnormalities are seen. Mild
midthoracic spondylosis.
IMPRESSION: No acute chest findings.

## 2021-11-19 ENCOUNTER — Ambulatory Visit (INDEPENDENT_AMBULATORY_CARE_PROVIDER_SITE_OTHER): Payer: Medicare Other

## 2021-11-19 VITALS — Ht 62.0 in | Wt 221.0 lb

## 2021-11-19 DIAGNOSIS — Z1211 Encounter for screening for malignant neoplasm of colon: Secondary | ICD-10-CM

## 2021-11-19 DIAGNOSIS — Z0000 Encounter for general adult medical examination without abnormal findings: Secondary | ICD-10-CM
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
## 2021-11-19 NOTE — Patient Instructions (Signed)
Sheena Velasquez , Thank you for taking time to come for your Medicare Wellness Visit. I appreciate your ongoing commitment to your health goals. Please review the following plan we discussed and let me know if I can assist you in the future.   Screening recommendations/referrals: Colonoscopy: cologuard ordered today Mammogram: scheduled for 04/03/2022 Bone Density: scheduled for 04/03/2022 Recommended yearly ophthalmology/optometry visit for glaucoma screening and checkup Recommended yearly dental visit for hygiene and checkup  Vaccinations: Influenza vaccine: due 01/01/2022 Pneumococcal vaccine: due Tdap vaccine: due Shingles vaccine: discussed   Covid-19:03/27/2021, 05/11/2020, 09/01/2019,   Advanced directives: Advance directive discussed with you today. Even though you declined this today please call our office should you change your mind and we can give you the proper paperwork for you to fill out.  Conditions/risks identified: none  Next appointment: Follow up in one year for your annual wellness visit    Preventive Care 65 Years and Older, Female Preventive care refers to lifestyle choices and visits with your health care provider that can promote health and wellness. What does preventive care include? A yearly physical exam. This is also called an annual well check. Dental exams once or twice a year. Routine eye exams. Ask your health care provider how often you should have your eyes checked. Personal lifestyle choices, including: Daily care of your teeth and gums. Regular physical activity. Eating a healthy diet. Avoiding tobacco and drug use. Limiting alcohol use. Practicing safe sex. Taking low-dose aspirin every day. Taking vitamin and mineral supplements as recommended by your health care provider. What happens during an annual well check? The services and screenings done by your health care provider during your annual well check will depend on your age, overall health,  lifestyle risk factors, and family history of disease. Counseling  Your health care provider may ask you questions about your: Alcohol use. Tobacco use. Drug use. Emotional well-being. Home and relationship well-being. Sexual activity. Eating habits. History of falls. Memory and ability to understand (cognition). Work and work Statistician. Reproductive health. Screening  You may have the following tests or measurements: Height, weight, and BMI. Blood pressure. Lipid and cholesterol levels. These may be checked every 5 years, or more frequently if you are over 64 years old. Skin check. Lung cancer screening. You may have this screening every year starting at age 34 if you have a 30-pack-year history of smoking and currently smoke or have quit within the past 15 years. Fecal occult blood test (FOBT) of the stool. You may have this test every year starting at age 59. Flexible sigmoidoscopy or colonoscopy. You may have a sigmoidoscopy every 5 years or a colonoscopy every 10 years starting at age 32. Hepatitis C blood test. Hepatitis B blood test. Sexually transmitted disease (STD) testing. Diabetes screening. This is done by checking your blood sugar (glucose) after you have not eaten for a while (fasting). You may have this done every 1-3 years. Bone density scan. This is done to screen for osteoporosis. You may have this done starting at age 70. Mammogram. This may be done every 1-2 years. Talk to your health care provider about how often you should have regular mammograms. Talk with your health care provider about your test results, treatment options, and if necessary, the need for more tests. Vaccines  Your health care provider may recommend certain vaccines, such as: Influenza vaccine. This is recommended every year. Tetanus, diphtheria, and acellular pertussis (Tdap, Td) vaccine. You may need a Td booster every 10 years. Zoster  vaccine. You may need this after age  50. Pneumococcal 13-valent conjugate (PCV13) vaccine. One dose is recommended after age 83. Pneumococcal polysaccharide (PPSV23) vaccine. One dose is recommended after age 54. Talk to your health care provider about which screenings and vaccines you need and how often you need them. This information is not intended to replace advice given to you by your health care provider. Make sure you discuss any questions you have with your health care provider. Document Released: 06/16/2015 Document Revised: 02/07/2016 Document Reviewed: 03/21/2015 Elsevier Interactive Patient Education  2017 [place] Prevention in the Home Falls can cause injuries. They can happen to people of all ages. There are many things you can do to make your home safe and to help prevent falls. What can I do on the outside of my home? Regularly fix the edges of walkways and driveways and fix any cracks. Remove anything that might make you trip as you walk through a door, such as a raised step or threshold. Trim any bushes or trees on the path to your home. Use bright outdoor lighting. Clear any walking paths of anything that might make someone trip, such as rocks or tools. Regularly check to see if handrails are loose or broken. Make sure that both sides of any steps have handrails. Any raised decks and porches should have guardrails on the edges. Have any leaves, snow, or ice cleared regularly. Use sand or salt on walking paths during winter. Clean up any spills in your garage right away. This includes oil or grease spills. What can I do in the bathroom? Use night lights. Install grab bars by the toilet and in the tub and shower. Do not use towel bars as grab bars. Use non-skid mats or decals in the tub or shower. If you need to sit down in the shower, use a plastic, non-slip stool. Keep the floor dry. Clean up any water that spills on the floor as soon as it happens. Remove soap buildup in the tub or shower  regularly. Attach bath mats securely with double-sided non-slip rug tape. Do not have throw rugs and other things on the floor that can make you trip. What can I do in the bedroom? Use night lights. Make sure that you have a light by your bed that is easy to reach. Do not use any sheets or blankets that are too big for your bed. They should not hang down onto the floor. Have a firm chair that has side arms. You can use this for support while you get dressed. Do not have throw rugs and other things on the floor that can make you trip. What can I do in the kitchen? Clean up any spills right away. Avoid walking on wet floors. Keep items that you use a lot in easy-to-reach places. If you need to reach something above you, use a strong step stool that has a grab bar. Keep electrical cords out of the way. Do not use floor polish or wax that makes floors slippery. If you must use wax, use non-skid floor wax. Do not have throw rugs and other things on the floor that can make you trip. What can I do with my stairs? Do not leave any items on the stairs. Make sure that there are handrails on both sides of the stairs and use them. Fix handrails that are broken or loose. Make sure that handrails are as long as the stairways. Check any carpeting to make sure that it  is firmly attached to the stairs. Fix any carpet that is loose or worn. Avoid having throw rugs at the top or bottom of the stairs. If you do have throw rugs, attach them to the floor with carpet tape. Make sure that you have a light switch at the top of the stairs and the bottom of the stairs. If you do not have them, ask someone to add them for you. What else can I do to help prevent falls? Wear shoes that: Do not have high heels. Have rubber bottoms. Are comfortable and fit you well. Are closed at the toe. Do not wear sandals. If you use a stepladder: Make sure that it is fully opened. Do not climb a closed stepladder. Make sure that  both sides of the stepladder are locked into place. Ask someone to hold it for you, if possible. Clearly mark and make sure that you can see: Any grab bars or handrails. First and last steps. Where the edge of each step is. Use tools that help you move around (mobility aids) if they are needed. These include: Canes. Walkers. Scooters. Crutches. Turn on the lights when you go into a dark area. Replace any light bulbs as soon as they burn out. Set up your furniture so you have a clear path. Avoid moving your furniture around. If any of your floors are uneven, fix them. If there are any pets around you, be aware of where they are. Review your medicines with your doctor. Some medicines can make you feel dizzy. This can increase your chance of falling. Ask your doctor what other things that you can do to help prevent falls. This information is not intended to replace advice given to you by your health care provider. Make sure you discuss any questions you have with your health care provider. Document Released: 03/16/2009 Document Revised: 10/26/2015 Document Reviewed: 06/24/2014 Elsevier Interactive Patient Education  2017 [employer].

## 2021-12-02 LAB — COLOGUARD: COLOGUARD: NEGATIVE

## 2021-12-10 ENCOUNTER — Other Ambulatory Visit: Payer: Self-pay | Admitting: Adult Health

## 2021-12-10 DIAGNOSIS — R7303 Prediabetes: Secondary | ICD-10-CM

## 2021-12-31 ENCOUNTER — Encounter: Payer: Self-pay | Admitting: Adult Health

## 2022-02-13 ENCOUNTER — Encounter: Payer: Self-pay | Admitting: Adult Health

## 2022-02-13 ENCOUNTER — Telehealth (INDEPENDENT_AMBULATORY_CARE_PROVIDER_SITE_OTHER): Payer: Medicare Other | Admitting: Adult Health

## 2022-02-13 VITALS — Ht 62.0 in | Wt 215.0 lb

## 2022-02-13 DIAGNOSIS — Z6841 Body Mass Index (BMI) 40.0 and over, adult: Secondary | ICD-10-CM | POA: Diagnosis not present

## 2022-02-13 DIAGNOSIS — R7303 Prediabetes: Secondary | ICD-10-CM

## 2022-02-13 MED ORDER — TRULICITY 1.5 MG/0.5ML ~~LOC~~ SOAJ: 1.5000 mg | SUBCUTANEOUS | 2 refills | Status: DC
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
## 2022-03-14 DIAGNOSIS — M25561 Pain in right knee: Secondary | ICD-10-CM | POA: Insufficient documentation

## 2022-03-25 ENCOUNTER — Encounter: Payer: Self-pay | Admitting: Adult Health

## 2022-04-03 ENCOUNTER — Ambulatory Visit
Admission: RE | Admit: 2022-04-03 | Discharge: 2022-04-03 | Disposition: A | Discharge status: Home / Self Care | Payer: Medicare Other | Source: Ambulatory Visit | Attending: Adult Health | Admitting: Adult Health

## 2022-04-03 DIAGNOSIS — Z1231 Encounter for screening mammogram for malignant neoplasm of breast: Secondary | ICD-10-CM

## 2022-04-03 DIAGNOSIS — E2839 Other primary ovarian failure: Secondary | ICD-10-CM

## 2022-04-08 ENCOUNTER — Other Ambulatory Visit: Payer: Self-pay | Admitting: Adult Health

## 2022-04-08 DIAGNOSIS — R928 Other abnormal and inconclusive findings on diagnostic imaging of breast: Secondary | ICD-10-CM

## 2022-04-22 ENCOUNTER — Ambulatory Visit
Admission: RE | Admit: 2022-04-22 | Discharge: 2022-04-22 | Disposition: A | Discharge status: Home / Self Care | Payer: Medicare Other | Source: Ambulatory Visit | Attending: Adult Health | Admitting: Adult Health

## 2022-04-22 DIAGNOSIS — R928 Other abnormal and inconclusive findings on diagnostic imaging of breast: Secondary | ICD-10-CM

## 2022-04-23 ENCOUNTER — Encounter: Payer: Self-pay | Admitting: Adult Health

## 2022-04-23 ENCOUNTER — Ambulatory Visit (INDEPENDENT_AMBULATORY_CARE_PROVIDER_SITE_OTHER): Payer: Medicare Other | Admitting: Adult Health

## 2022-04-23 VITALS — BP 120/80 | HR 88 | Temp 97.6°F | Ht 62.0 in | Wt 218.0 lb

## 2022-04-23 DIAGNOSIS — I10 Essential (primary) hypertension: Secondary | ICD-10-CM

## 2022-04-23 DIAGNOSIS — R7303 Prediabetes: Secondary | ICD-10-CM | POA: Diagnosis not present

## 2022-04-23 LAB — POCT GLYCOSYLATED HEMOGLOBIN (HGB A1C): Hemoglobin A1C: 5.6 % (ref 4.0–5.6)
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
## 2022-04-23 NOTE — Patient Instructions (Signed)
Health Maintenance Due  Topic Date Due   Pneumonia Vaccine 31+ Years old (1 - PCV) Never done   Zoster Vaccines- Shingrix (1 of 2) Never done   COLONOSCOPY (Pts 45-10yr Insurance coverage will need to be confirmed)  03/26/2021      Row Labels 11/19/2021    1:14 PM 09/19/2021   10:29 AM  Depression screen PHQ 2/9   Section Header. No data exists in this row.    Decreased Interest   0 1  Down, Depressed, Hopeless   0 0  PHQ - 2 Score   0 1

## 2022-05-07 ENCOUNTER — Other Ambulatory Visit: Payer: Self-pay | Admitting: Adult Health

## 2022-05-07 DIAGNOSIS — R7303 Prediabetes: Secondary | ICD-10-CM

## 2022-05-07 DIAGNOSIS — Z6841 Body Mass Index (BMI) 40.0 and over, adult: Secondary | ICD-10-CM

## 2022-06-11 ENCOUNTER — Telehealth (INDEPENDENT_AMBULATORY_CARE_PROVIDER_SITE_OTHER): Payer: Medicare Other | Admitting: Adult Health

## 2022-06-11 ENCOUNTER — Encounter: Payer: Self-pay | Admitting: Adult Health

## 2022-06-11 VITALS — Ht 62.0 in | Wt 221.0 lb

## 2022-06-11 DIAGNOSIS — R7303 Prediabetes: Secondary | ICD-10-CM

## 2022-06-11 DIAGNOSIS — Z6841 Body Mass Index (BMI) 40.0 and over, adult: Secondary | ICD-10-CM | POA: Diagnosis not present
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
## 2022-06-12 ENCOUNTER — Other Ambulatory Visit: Payer: Self-pay | Admitting: Adult Health

## 2022-06-12 MED ORDER — WEGOVY 0.25 MG/0.5ML ~~LOC~~ SOAJ: 0.2500 mg | SUBCUTANEOUS | 0 refills | Status: AC

## 2022-07-02 ENCOUNTER — Other Ambulatory Visit (HOSPITAL_COMMUNITY): Payer: Self-pay

## 2022-07-05 NOTE — Telephone Encounter (Signed)
Pharmacy Patient Advocate Encounter  Received notification from [facility] that the request for prior authorization for [facility] has been denied due to .    Please be advised we currently do not have a Pharmacist to review denials, therefore you will need to process appeals accordingly as needed. Thanks for your support at this time.   If appeal is needed, please see below

## 2022-07-31 ENCOUNTER — Emergency Department (HOSPITAL_COMMUNITY): Payer: Medicare Other

## 2022-07-31 ENCOUNTER — Encounter (HOSPITAL_COMMUNITY): Payer: Self-pay

## 2022-07-31 ENCOUNTER — Other Ambulatory Visit: Payer: Self-pay

## 2022-07-31 ENCOUNTER — Emergency Department (HOSPITAL_COMMUNITY)
Admission: EM | Admit: 2022-07-31 | Discharge: 2022-07-31 | Disposition: A | Discharge status: Home / Self Care | Payer: Medicare Other | Attending: Emergency Medicine | Admitting: Emergency Medicine

## 2022-07-31 DIAGNOSIS — I10 Essential (primary) hypertension: Secondary | ICD-10-CM | POA: Diagnosis not present

## 2022-07-31 DIAGNOSIS — W182XXA Fall in (into) shower or empty bathtub, initial encounter: Secondary | ICD-10-CM | POA: Insufficient documentation

## 2022-07-31 DIAGNOSIS — Z7951 Long term (current) use of inhaled steroids: Secondary | ICD-10-CM | POA: Insufficient documentation

## 2022-07-31 DIAGNOSIS — Z79899 Other long term (current) drug therapy: Secondary | ICD-10-CM | POA: Insufficient documentation

## 2022-07-31 DIAGNOSIS — J45909 Unspecified asthma, uncomplicated: Secondary | ICD-10-CM | POA: Diagnosis not present

## 2022-07-31 DIAGNOSIS — S060X0A Concussion without loss of consciousness, initial encounter: Secondary | ICD-10-CM | POA: Diagnosis not present

## 2022-07-31 DIAGNOSIS — R519 Headache, unspecified: Secondary | ICD-10-CM | POA: Diagnosis present

## 2022-07-31 MED ORDER — FENTANYL CITRATE PF 50 MCG/ML IJ SOSY: 50.0000 ug | PREFILLED_SYRINGE | Freq: Once | INTRAMUSCULAR | Status: DC

## 2022-07-31 MED ORDER — FENTANYL CITRATE PF 50 MCG/ML IJ SOSY
50.0000 ug | PREFILLED_SYRINGE | Freq: Once | INTRAMUSCULAR | Status: AC
  Administered 2022-07-31: 50 ug via INTRAVENOUS
  Filled 2022-07-31: qty 1

## 2022-07-31 MED ORDER — ONDANSETRON HCL 4 MG/2ML IJ SOLN
4.0000 mg | Freq: Once | INTRAMUSCULAR | Status: AC
  Administered 2022-07-31: 4 mg via INTRAVENOUS
  Filled 2022-07-31: qty 2

## 2022-07-31 MED ORDER — IBUPROFEN 800 MG PO TABS
800.0000 mg | ORAL_TABLET | Freq: Once | ORAL | Status: AC
  Administered 2022-07-31: 800 mg via ORAL
  Filled 2022-07-31: qty 1

## 2022-07-31 MED ORDER — ONDANSETRON 4 MG PO TBDP: 4.0000 mg | ORAL_TABLET | ORAL | Status: DC | PRN

## 2022-07-31 MED ORDER — CYCLOBENZAPRINE HCL 10 MG PO TABS: 10.0000 mg | ORAL_TABLET | Freq: Every day | ORAL | 0 refills | Status: DC
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
## 2022-07-31 NOTE — ED Provider Notes (Signed)
 [place] EMERGENCY DEPARTMENT AT [facility] Provider Note   CSN: [id number] Arrival date & time: 07/31/22  1249     History Chief Complaint  Patient presents with   Sheena Velasquez    Sheena Velasquez is a 67 y.o. female.  Patient with past medical history significant for hypertension, asthma presents emergency department following mechanical fall.  She reports that she fell when she was in her bathtub and she was reaching for a towel.  She reports that she struck the left side of her head on the bathtub but did not experience any obvious loss of consciousness.  Patient not currently on any blood thinners.  Patient also reports tenderness to the right upper thigh and right low back.  She reports that she is able to ambulate to the emergency department.  Patient had to be helped up by her grandson immediately after the fall.  Reports that nausea has improved but has been present when she moves her head.   Fall Associated symptoms include headaches.       Home Medications Prior to Admission medications   Medication Sig Start Date End Date Taking? Authorizing Provider  cyclobenzaprine (FLEXERIL) 10 MG tablet Take 1 tablet (10 mg total) by mouth at bedtime. 07/31/22  Yes Smitty Knudsen, PA-C  Cholecalciferol (VITAMIN D PO) Take 500 Units by mouth daily.    [provider]  felodipine (PLENDIL) 10 MG 24 hr tablet TAKE 1 TABLET(10 MG) BY MOUTH DAILY 09/19/21   Nafziger, Kandee Keen, NP  fluticasone furoate-vilanterol (BREO ELLIPTA) 100-25 MCG/ACT AEPB Inhale 1 puff into the lungs daily. 09/19/21   Nafziger, Kandee Keen, NP  hydrochlorothiazide (HYDRODIURIL) 25 MG tablet TAKE 1 TABLET(25 MG) BY MOUTH DAILY 09/19/21   Nafziger, Kandee Keen, NP  lisinopril (ZESTRIL) 40 MG tablet TAKE 1 TABLET(40 MG) BY MOUTH DAILY 09/19/21   Nafziger, Kandee Keen, NP  Magnesium 250 MG TABS Take 1 tablet by mouth daily.    [provider]  naproxen (NAPROSYN) 500 MG tablet naproxen 500 mg tablet  TAKE 1 TABLET BY MOUTH  TWICE DAILY    [provider]  omeprazole (PRILOSEC) 20 MG capsule TAKE 1 CAPSULE(20 MG) BY MOUTH TWICE DAILY BEFORE A MEAL 09/19/21   Nafziger, Kandee Keen, NP  temazepam (RESTORIL) 15 MG capsule TAKE 1 CAPSULE(15 MG) BY MOUTH AT BEDTIME AS NEEDED FOR SLEEP 09/19/21   Nafziger, Kandee Keen, NP  TRULICITY 1.5 MG/0.5ML SOPN ADMINISTER 1.5 MG UNDER THE SKIN 1 TIME A WEEK 05/07/22   Nafziger, Kandee Keen, NP      Allergies    Cefuroxime, Simvastatin, and Mobic [meloxicam]    Review of Systems   Review of Systems  Constitutional:  Negative for chills and fever.  Respiratory:  Negative for cough.   Gastrointestinal:  Positive for nausea and vomiting.  Neurological:  Positive for headaches.  All other systems reviewed and are negative.   Physical Exam Updated Vital Signs BP 127/80   Pulse 81   Temp 97.7 F (36.5 C)   Resp 16   SpO2 92%  Physical Exam Vitals and nursing note reviewed.  Constitutional:      General: She is not in acute distress.    Appearance: Normal appearance. She is obese. She is not ill-appearing.  HENT:     Head: Normocephalic and atraumatic.     Nose: No congestion or rhinorrhea.     Mouth/Throat:     Pharynx: No oropharyngeal exudate or posterior oropharyngeal erythema.  Cardiovascular:     Rate and Rhythm: Normal  rate and regular rhythm.     Pulses: Normal pulses.     Heart sounds: Normal heart sounds. No murmur heard. Pulmonary:     Effort: Pulmonary effort is normal.     Breath sounds: Normal breath sounds.  Abdominal:     General: Abdomen is flat.  Musculoskeletal:        General: Tenderness present. No swelling or deformity. Normal range of motion.     Right lower leg: No edema.  Skin:    General: Skin is warm and dry.     Capillary Refill: Capillary refill takes less than 2 seconds.     Findings: No bruising.  Neurological:     General: No focal deficit present.     Mental Status: She is alert.     Cranial Nerves: No cranial nerve deficit.     Motor: No  weakness.  Psychiatric:        Mood and Affect: Mood normal.     ED Results / Procedures / Treatments   Labs (all labs ordered are listed, but only abnormal results are displayed) Labs Reviewed - No data to display  EKG None  Radiology CT Head Wo Contrast  Result Date: 07/31/2022 CLINICAL DATA:  Neck trauma.  Head trauma. EXAM: CT HEAD WITHOUT CONTRAST CT CERVICAL SPINE WITHOUT CONTRAST TECHNIQUE: Multidetector CT imaging of the head and cervical spine was performed following the standard protocol without intravenous contrast. Multiplanar CT image reconstructions of the cervical spine were also generated. RADIATION DOSE REDUCTION: This exam was performed according to the departmental dose-optimization program which includes automated exposure control, adjustment of the mA and/or kV according to patient size and/or use of iterative reconstruction technique. COMPARISON:  None Available. FINDINGS: CT HEAD FINDINGS Brain: No acute intracranial hemorrhage. No focal mass lesion. No CT evidence of acute infarction. No midline shift or mass effect. No hydrocephalus. Basilar cisterns are patent. Mild periventricular and subcortical white matter hypodensities. Generalized cortical atrophy. Vascular: No hyperdense vessel or unexpected calcification. Skull: Normal. Negative for fracture or focal lesion. Sinuses/Orbits: Paranasal sinuses and mastoid air cells are clear. Orbits are clear. Other: None. CT CERVICAL SPINE FINDINGS Alignment: Normal alignment of the cervical vertebral bodies. Skull base and vertebrae: Normal craniocervical junction. No loss of vertebral body height or disc height. Normal facet articulation. No evidence of fracture. Soft tissues and spinal canal: No prevertebral soft tissue swelling. No perispinal or epidural hematoma. Disc levels:  Unremarkable Upper chest: Clear Other: None IMPRESSION: 1. No intracranial trauma. 2. 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 3. No cervical  spine fracture. Electronically Signed   By: Genevive Bi M.D.   On: 07/31/2022 14:37   CT Cervical Spine Wo Contrast  Result Date: 07/31/2022 CLINICAL DATA:  Neck trauma.  Head trauma. EXAM: CT HEAD WITHOUT CONTRAST CT CERVICAL SPINE WITHOUT CONTRAST TECHNIQUE: Multidetector CT imaging of the head and cervical spine was performed following the standard protocol without intravenous contrast. Multiplanar CT image reconstructions of the cervical spine were also generated. RADIATION DOSE REDUCTION: This exam was performed according to the departmental dose-optimization program which includes automated exposure control, adjustment of the mA and/or kV according to patient size and/or use of iterative reconstruction technique. COMPARISON:  None Available. FINDINGS: CT HEAD FINDINGS Brain: No acute intracranial hemorrhage. No focal mass lesion. No CT evidence of acute infarction. No midline shift or mass effect. No hydrocephalus. Basilar cisterns are patent. Mild periventricular and subcortical white matter hypodensities. Generalized cortical atrophy. Vascular: No hyperdense vessel or unexpected  calcification. Skull: Normal. Negative for fracture or focal lesion. Sinuses/Orbits: Paranasal sinuses and mastoid air cells are clear. Orbits are clear. Other: None. CT CERVICAL SPINE FINDINGS Alignment: Normal alignment of the cervical vertebral bodies. Skull base and vertebrae: Normal craniocervical junction. No loss of vertebral body height or disc height. Normal facet articulation. No evidence of fracture. Soft tissues and spinal canal: No prevertebral soft tissue swelling. No perispinal or epidural hematoma. Disc levels:  Unremarkable Upper chest: Clear Other: None IMPRESSION: 1. No intracranial trauma. 2. 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 3. No cervical spine fracture. Electronically Signed   By: Genevive Bi M.D.   On: 07/31/2022 14:37   DG Lumbar Spine Complete  Result Date:  07/31/2022 CLINICAL DATA:  Low back pain. EXAM: LUMBAR SPINE - COMPLETE 4+ VIEW COMPARISON:  CT of the Lumbar spine 06/28/2005. FINDINGS: Bony fusion in the lower lumbar spine.Similar grade 1 anterolisthesis of L4 on L5. No evidence of acute fracture. Multilevel degenerative disc disease, greatest at L4-L5. Multilevel facet arthropathy. IMPRESSION: 1. No evidence of acute fracture or traumatic malalignment. 2. Bony fusion in the lower lumbar spine and multilevel degenerative change. Electronically Signed   By: Feliberto Harts M.D.   On: 07/31/2022 14:01   DG FEMUR, MIN 2 VIEWS RIGHT  Result Date: 07/31/2022 CLINICAL DATA:  Fall EXAM: RIGHT FEMUR 2 VIEWS COMPARISON:  Right hip radiographs 01/14/2012 FINDINGS: There is no acute fracture or dislocation of the femur. Hip and knee alignment appears maintained. The soft tissues are unremarkable. IMPRESSION: No acute fracture or dislocation. Electronically Signed   By: Lesia Hausen M.D.   On: 07/31/2022 14:00    Procedures Procedures   Medications Ordered in ED Medications  ibuprofen (ADVIL) tablet 800 mg (800 mg Oral Given 07/31/22 1517)  ondansetron (ZOFRAN) injection 4 mg (4 mg Intravenous Given 07/31/22 1511)  fentaNYL (SUBLIMAZE) injection 50 mcg (50 mcg Intravenous Given 07/31/22 1511)    ED Course/ Medical Decision Making/ A&P                           Medical Decision Making Amount and/or Complexity of Data Reviewed Radiology: ordered.  Risk Prescription drug management.   This patient presents to the ED for concern of fall.  Differential diagnosis includes syncope, arrhythmia, cranial bleed, subdural hematoma   Imaging Studies ordered:  I ordered imaging studies including head, CT cervical spine, x-ray of lumbar spine and right femur I independently visualized and interpreted imaging which showed no acute fractures or dislocations, no evidence of intracranial abnormalities I agree with the radiologist interpretation   Medicines  ordered and prescription drug management:  I ordered medication including Zofran, fentanyl for nausea, pain Reevaluation of the patient after these medicines showed that the patient improved I have reviewed the patients home medicines and have made adjustments as needed   Problem List / ED Course:  Patient presents to the emergency department complaining of a mechanical fall.  She reports that she was in the shower earlier today she was attempting to get out of the shower when she stumbled and hit her left side of the head against the tub.  She reports that she also hit her right side of her body primarily along the thigh.  She has been complaining of some associated right thigh pain as well.  Patient has been having some associated nausea with the fall but denies any significant or severe headache, no blurry or change in vision, no  chest pain, no shortness of breath.  Patient has not experienced any kind of movement disorder since this fall occurred.  Patient not currently on any blood thinners.  No prior history of strokes.  Given presentation, I feel reassured that patient is likely experiencing concussion like symptoms, but CT imaging was performed which was reassuring with no signs of any fractures dislocations or any intracranial abnormalities.  Informed patient of these findings and advised patient to manage symptoms at home with cognitive rest as this is most likely concussion.  A prescription for Flexeril sent to patient's pharmacy.  Patient was agreeable to treatment plan verbalized understanding all return precautions.  All questions answered prior to patient discharge.  Final Clinical Impression(s) / ED Diagnoses Final diagnoses:  Concussion without loss of consciousness, initial encounter    Rx / DC Orders ED Discharge Orders          Ordered    cyclobenzaprine (FLEXERIL) 10 MG tablet  Daily at bedtime        07/31/22 1540              Smitty Knudsen, PA-C 07/31/22  1557    Elayne Snare K, DO 08/01/22 [phone]

## 2022-07-31 NOTE — ED Notes (Signed)
Patient transported to X-ray 

## 2022-07-31 NOTE — ED Provider Triage Note (Signed)
Emergency Medicine Provider Triage Evaluation Note  Sheena Velasquez , a 67 y.o. female  was evaluated in triage.  Pt complains of fall. Patient reports she was trying to get out of shower when she tripped and fell, striking the left side of her face. She now reports some associated nausea and vomiting since the fall. Denies LOC. No blood thinners. No prior history of strokes. Also reporting right low back and femur pain, but no gross abnormalities since fall were noted.  Review of Systems  Positive: As above Negative: As above  Physical Exam  BP 127/80   Pulse 81   Temp 97.7 F (36.5 C)   Resp 16   SpO2 92%  Gen:   Awake, no distress Resp:  Normal effort MSK:   Decreased ROM in RLE due to tenderness along hip and mid thigh. No obvious deformity, or ecchymosis. Other:  EOMs intact. AOx3.  Medical Decision Making  Medically screening exam initiated at 1:05 PM.  Appropriate orders placed.  Sheena Velasquez was informed that the remainder of the evaluation will be completed by another provider, this initial triage assessment does not replace that evaluation, and the importance of remaining in the ED until their evaluation is complete.     Luvenia Heller, PA-C 07/31/22 1308

## 2022-07-31 NOTE — ED Triage Notes (Signed)
Pt arrives via EMS from home. Pt was in the tub, reached for a towel and accidentally fell. Pt struck the left side of her head on the tub. No loc, no blood thinners, and pt is AxOx4. Pt reports after hitting her head, she did begin having nausea and vomiting. Pt denies cervical tenderness but is currently in c-collar. Patient's endorses pain to her right upper thigh, cms is intact. EMS administered 134mg of fentanyl en route.

## 2022-08-01 ENCOUNTER — Telehealth: Payer: Self-pay | Admitting: Adult Health

## 2022-08-01 MED ORDER — ONDANSETRON HCL 4 MG PO TABS: 4.0000 mg | ORAL_TABLET | Freq: Three times a day (TID) | ORAL | 0 refills | Status: DC | PRN

## 2022-08-01 NOTE — Addendum Note (Signed)
Addended by: Gwenyth Ober R on: 08/01/2022 04:15 PM   Modules accepted: Orders

## 2022-08-01 NOTE — Telephone Encounter (Signed)
Says mother fell last night, went to [facility] via ambulance, concussion severe right leg pain, cannot keep food down (vomiting). Requesting something to help with the nausea and vomiting.  [facility] [place], [place] Phone: [phone]  Fax: [phone]

## 2022-08-01 NOTE — Telephone Encounter (Signed)
Prescription sent to pharmacy. No other action needed. Pt informed!

## 2022-08-01 NOTE — Telephone Encounter (Signed)
Please advise 

## 2022-08-02 ENCOUNTER — Telehealth: Payer: Self-pay

## 2022-08-02 NOTE — Patient Outreach (Signed)
  Care Coordination   Initial Visit Note   08/02/2022 Name: Sheena Velasquez MRN: [id number] DOB: 07/27/1955  Sheena Velasquez is a 67 y.o. year old female who sees Nafziger, Tommi Rumps, NP for primary care. I spoke with  [name] by phone today.  What matters to the patients health and wellness today?  Patient was in the ED on 07/31/22 after sustaining a fall. She fell and slipped in the bathtub. She reports that she is having pain to her right leg and it is slightly swollen. Denies any bruising or discoloration. She states x-rays were negative in the ER. She has been taking OTC pain meds. Her daughter has called MD office to see if she can get something stronger to better manage pain. She is resting and taking it easy-keeping leg elevated. She reports she was a little nauseous this morning and her daughter has gone to pick up some nausea med for her. She has PCP follow up appt on 08/06/22. She denies any RN CM needs or concerns at this time.   Goals Addressed   None     SDOH assessments and interventions completed:  Yes  SDOH Interventions Today    Flowsheet Row Most Recent Value  SDOH Interventions   Food Insecurity Interventions Intervention Not Indicated  Transportation Interventions Intervention Not Indicated        Care Coordination Interventions:  Yes, provided    Interventions Today    Flowsheet Row Most Recent Value  Education Interventions   Education Provided Provided Education  [pain mgmt]  Provided Verbal Education On Nutrition, Medication, When to see the doctor  Nutrition Interventions   Nutrition Discussed/Reviewed Nutrition Discussed  Pharmacy Interventions   Pharmacy Dicussed/Reviewed Pharmacy Topics Discussed, Medications and their functions  Safety Interventions   Safety Discussed/Reviewed Safety Discussed, Fall Risk       TOC Interventions Today    Flowsheet Row Most Recent Value  TOC Interventions   TOC Interventions Discussed/Reviewed Post  discharge activity limitations per provider, TOC Interventions Discussed       Follow up plan: No further intervention required.   Encounter Outcome:  Pt. Visit Completed    [facility] Care Management Care Management Community Coordinator Direct Phone: [phone] Toll Free: [phone] Fax: [phone]

## 2022-08-06 ENCOUNTER — Ambulatory Visit (INDEPENDENT_AMBULATORY_CARE_PROVIDER_SITE_OTHER): Payer: Medicare Other | Admitting: Adult Health

## 2022-08-06 ENCOUNTER — Telehealth: Payer: Self-pay | Admitting: Adult Health

## 2022-08-06 ENCOUNTER — Ambulatory Visit (INDEPENDENT_AMBULATORY_CARE_PROVIDER_SITE_OTHER)
Admission: RE | Admit: 2022-08-06 | Discharge: 2022-08-06 | Disposition: A | Discharge status: Home / Self Care | Payer: Medicare Other | Source: Ambulatory Visit | Attending: Adult Health | Admitting: Adult Health

## 2022-08-06 ENCOUNTER — Encounter: Payer: Self-pay | Admitting: Adult Health

## 2022-08-06 VITALS — BP 148/90 | HR 90 | Temp 97.4°F | Ht 62.0 in | Wt 224.0 lb

## 2022-08-06 DIAGNOSIS — M25551 Pain in right hip: Secondary | ICD-10-CM | POA: Diagnosis not present

## 2022-08-06 DIAGNOSIS — M79604 Pain in right leg: Secondary | ICD-10-CM

## 2022-08-06 MED ORDER — METHYLPREDNISOLONE 4 MG PO TBPK: ORAL_TABLET | ORAL | 0 refills | Status: DC

## 2022-08-06 MED ORDER — TRAMADOL HCL 50 MG PO TABS: 50.0000 mg | ORAL_TABLET | Freq: Two times a day (BID) | ORAL | 0 refills | Status: DC

## 2022-08-06 NOTE — Telephone Encounter (Signed)
Prescription Request  08/06/2022  LOV: 08/06/2022  What is the name of the medication or equipment? traMADol (ULTRAM) 50 MG tablet   Have you contacted your pharmacy to request a refill? No   Which pharmacy would you like this sent to?    [id number] [address]Phone:[phone]:[phone]   Patient notified that their request is being sent to the clinical staff for review and that they should receive a response within 2 business days.   Please advise at Mobile [phone](mobile)
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
## 2022-08-06 NOTE — Patient Instructions (Addendum)
It was great seeing you today and I am sorry you are in so much pain   I have sent in a medrol dose pack ( predisone) and some Tramadol pain medication ( take at night)   I have ordered an xray to be done at [facility], [place] the basement and look for " Radiology"

## 2022-08-06 NOTE — Telephone Encounter (Signed)
Please advise 

## 2022-08-07 ENCOUNTER — Other Ambulatory Visit: Payer: Self-pay | Admitting: Adult Health

## 2022-08-07 DIAGNOSIS — M79604 Pain in right leg: Secondary | ICD-10-CM

## 2022-08-07 DIAGNOSIS — M25551 Pain in right hip: Secondary | ICD-10-CM

## 2022-08-07 MED ORDER — TRAMADOL HCL 50 MG PO TABS: 50.0000 mg | ORAL_TABLET | Freq: Two times a day (BID) | ORAL | 0 refills | Status: DC

## 2022-08-07 NOTE — Telephone Encounter (Signed)
Called to check on progress. Medication was sent to incorrect pharmacy, requested for it to be [facility] to [id number] [place] [address] [address] [address]Phone: [phone]: [phone]

## 2022-08-07 NOTE — Telephone Encounter (Signed)
Patient notified of update  and verbalized understanding. 

## 2022-08-07 NOTE — Telephone Encounter (Signed)
Noted  

## 2022-08-07 NOTE — Telephone Encounter (Signed)
Noted! Can you resend to the pharmacy below

## 2022-10-21 DIAGNOSIS — M1711 Unilateral primary osteoarthritis, right knee: Secondary | ICD-10-CM | POA: Insufficient documentation

## 2022-10-25 ENCOUNTER — Other Ambulatory Visit: Payer: Self-pay | Admitting: Adult Health

## 2022-10-25 DIAGNOSIS — K2100 Gastro-esophageal reflux disease with esophagitis, without bleeding: Secondary | ICD-10-CM

## 2023-01-06 ENCOUNTER — Other Ambulatory Visit: Payer: Self-pay | Admitting: Adult Health

## 2023-01-06 DIAGNOSIS — M25551 Pain in right hip: Secondary | ICD-10-CM

## 2023-01-06 DIAGNOSIS — M79604 Pain in right leg: Secondary | ICD-10-CM

## 2023-01-22 ENCOUNTER — Encounter: Payer: Self-pay | Admitting: Adult Health

## 2023-01-22 DIAGNOSIS — I10 Essential (primary) hypertension: Secondary | ICD-10-CM

## 2023-01-22 MED ORDER — FELODIPINE ER 10 MG PO TB24: ORAL_TABLET | ORAL | 3 refills | Status: DC

## 2023-01-23 ENCOUNTER — Other Ambulatory Visit: Payer: Self-pay

## 2023-01-23 DIAGNOSIS — I10 Essential (primary) hypertension: Secondary | ICD-10-CM

## 2023-01-29 ENCOUNTER — Encounter: Payer: Self-pay | Admitting: Adult Health

## 2023-01-29 DIAGNOSIS — I10 Essential (primary) hypertension: Secondary | ICD-10-CM

## 2023-01-29 MED ORDER — LISINOPRIL 40 MG PO TABS: ORAL_TABLET | ORAL | 3 refills | Status: DC

## 2023-01-29 MED ORDER — HYDROCHLOROTHIAZIDE 25 MG PO TABS: ORAL_TABLET | ORAL | 3 refills | Status: DC

## 2023-01-31 ENCOUNTER — Other Ambulatory Visit: Payer: Self-pay

## 2023-02-21 ENCOUNTER — Encounter: Payer: Self-pay | Admitting: Adult Health

## 2023-04-30 ENCOUNTER — Other Ambulatory Visit: Payer: Self-pay

## 2023-04-30 ENCOUNTER — Encounter (HOSPITAL_COMMUNITY): Payer: Self-pay

## 2023-04-30 ENCOUNTER — Emergency Department (HOSPITAL_COMMUNITY): Payer: Medicare Other

## 2023-04-30 ENCOUNTER — Ambulatory Visit
Admission: EM | Admit: 2023-04-30 | Discharge: 2023-04-30 | Disposition: A | Discharge status: Other Acute Inpatient Hospital | Payer: Medicare Other

## 2023-04-30 ENCOUNTER — Emergency Department (HOSPITAL_COMMUNITY)
Admission: EM | Admit: 2023-04-30 | Discharge: 2023-04-30 | Disposition: A | Discharge status: Home / Self Care | Payer: Medicare Other | Attending: Emergency Medicine | Admitting: Emergency Medicine

## 2023-04-30 DIAGNOSIS — D72829 Elevated white blood cell count, unspecified: Secondary | ICD-10-CM | POA: Diagnosis not present

## 2023-04-30 DIAGNOSIS — R0789 Other chest pain: Secondary | ICD-10-CM | POA: Diagnosis not present

## 2023-04-30 DIAGNOSIS — R079 Chest pain, unspecified: Secondary | ICD-10-CM | POA: Insufficient documentation

## 2023-04-30 DIAGNOSIS — R7989 Other specified abnormal findings of blood chemistry: Secondary | ICD-10-CM | POA: Insufficient documentation

## 2023-04-30 DIAGNOSIS — J45909 Unspecified asthma, uncomplicated: Secondary | ICD-10-CM | POA: Diagnosis not present

## 2023-04-30 DIAGNOSIS — I10 Essential (primary) hypertension: Secondary | ICD-10-CM | POA: Insufficient documentation

## 2023-04-30 DIAGNOSIS — Z1152 Encounter for screening for COVID-19: Secondary | ICD-10-CM | POA: Diagnosis not present

## 2023-04-30 DIAGNOSIS — Z79899 Other long term (current) drug therapy: Secondary | ICD-10-CM | POA: Diagnosis not present

## 2023-04-30 DIAGNOSIS — T7840XA Allergy, unspecified, initial encounter: Secondary | ICD-10-CM | POA: Diagnosis not present

## 2023-04-30 LAB — CBC WITH DIFFERENTIAL/PLATELET
Abs Immature Granulocytes: 0.04 10*3/uL (ref 0.00–0.07)
Basophils Absolute: 0.1 10*3/uL (ref 0.0–0.1)
Basophils Relative: 1 %
Eosinophils Absolute: 0.3 10*3/uL (ref 0.0–0.5)
Eosinophils Relative: 2 %
HCT: 46 % (ref 36.0–46.0)
Hemoglobin: 14.3 g/dL (ref 12.0–15.0)
Immature Granulocytes: 0 %
Lymphocytes Relative: 21 %
Lymphs Abs: 2.6 10*3/uL (ref 0.7–4.0)
MCH: 25.8 pg — ABNORMAL LOW (ref 26.0–34.0)
MCHC: 31.1 g/dL (ref 30.0–36.0)
MCV: 83 fL (ref 80.0–100.0)
Monocytes Absolute: 0.9 10*3/uL (ref 0.1–1.0)
Monocytes Relative: 8 %
Neutro Abs: 8.3 10*3/uL — ABNORMAL HIGH (ref 1.7–7.7)
Neutrophils Relative %: 68 %
Platelets: 312 10*3/uL (ref 150–400)
RBC: 5.54 MIL/uL — ABNORMAL HIGH (ref 3.87–5.11)
RDW: 15.1 % (ref 11.5–15.5)
WBC: 12.2 10*3/uL — ABNORMAL HIGH (ref 4.0–10.5)
nRBC: 0 % (ref 0.0–0.2)

## 2023-04-30 LAB — BASIC METABOLIC PANEL
Anion gap: 11 (ref 5–15)
BUN: 17 mg/dL (ref 8–23)
CO2: 22 mmol/L (ref 22–32)
Calcium: 9.4 mg/dL (ref 8.9–10.3)
Chloride: 105 mmol/L (ref 98–111)
Creatinine, Ser: 1.07 mg/dL — ABNORMAL HIGH (ref 0.44–1.00)
GFR, Estimated: 57 mL/min — ABNORMAL LOW (ref >60)
Glucose, Bld: 87 mg/dL (ref 70–99)
Potassium: 3.8 mmol/L (ref 3.5–5.1)
Sodium: 138 mmol/L (ref 135–145)

## 2023-04-30 LAB — TROPONIN I (HIGH SENSITIVITY)
Troponin I (High Sensitivity): 4 ng/L (ref <18)
Troponin I (High Sensitivity): 5 ng/L (ref <18)

## 2023-04-30 LAB — SARS CORONAVIRUS 2 BY RT PCR: SARS Coronavirus 2 by RT PCR: NEGATIVE

## 2023-04-30 MED ORDER — BENZONATATE 100 MG PO CAPS: 100.0000 mg | ORAL_CAPSULE | Freq: Three times a day (TID) | ORAL | 0 refills | Status: DC

## 2023-04-30 MED ORDER — FAMOTIDINE IN NACL 20-0.9 MG/50ML-% IV SOLN
20.0000 mg | Freq: Once | INTRAVENOUS | Status: AC
  Administered 2023-04-30: 20 mg via INTRAVENOUS
  Filled 2023-04-30: qty 50

## 2023-04-30 MED ORDER — METHYLPREDNISOLONE SODIUM SUCC 125 MG IJ SOLR
125.0000 mg | Freq: Once | INTRAMUSCULAR | Status: AC
  Administered 2023-04-30: 125 mg via INTRAVENOUS
  Filled 2023-04-30: qty 2

## 2023-04-30 MED ORDER — ASPIRIN 81 MG PO CHEW
324.0000 mg | CHEWABLE_TABLET | Freq: Once | ORAL | Status: AC
  Administered 2023-04-30: 324 mg via ORAL

## 2023-04-30 MED ORDER — PREDNISONE 20 MG PO TABS: 40.0000 mg | ORAL_TABLET | Freq: Every day | ORAL | 0 refills | Status: AC

## 2023-04-30 MED ORDER — DIPHENHYDRAMINE HCL 50 MG/ML IJ SOLN
50.0000 mg | Freq: Once | INTRAMUSCULAR | Status: AC
  Administered 2023-04-30: 50 mg via INTRAMUSCULAR

## 2023-04-30 NOTE — ED Triage Notes (Signed)
"  I ate off the taco truck yesterday, following that my left side of my mouth was swelling, I got up this morning and my lips/mouth is swelling". "I also have had a cold all week but my chest is feeling tight and heavy".

## 2023-04-30 NOTE — ED Notes (Signed)
Attempted IV access x 2 tries, not able to get IV.

## 2023-04-30 NOTE — Discharge Instructions (Addendum)
Today you were seen for chest pain.  Please pick up your prescriptions and take as prescribed.  Please discontinue your lisinopril.  Please schedule an appointment with your primary care physician for evaluation of hypertension.  Thank you for letting us treat you today. After reviewing your labs and imaging, I feel you are safe to go home. Please follow up with your PCP in the next several days and provide them with your records from this visit. Return to the Emergency Room if pain becomes severe or symptoms worsen.

## 2023-04-30 NOTE — ED Provider Notes (Signed)
Patient presented to myself from Claudette Stapler, PA-C at shift change.  Patient is pending labs and official imaging read.  Patient has history of asthma.  Patient will likely be discharge with symptomatic management of upper respiratory infection once ACS is ruled out.  Patient also advised to stop taking lisinopril due to likely cause of current lip swelling and to follow-up with PCP for hypertension management.   Labs: CBC: Mild leukocytosis most likely due to acute URI. BMP: Mildly elevated creatinine at 1.07 Troponin: 4, 5 EKG: Sinus rhythm, T wave abnormality COVID PCR: Negative  Imaging: Chest x-ray: Low lung volumes without evidence of acute cardiopulmonary disease   Patient discharged with steroid pack and Tessalon Perles for cough.  Patient instructed to discontinue lisinopril due to intermittent lip swelling.  Patient should follow-up with PCP for hypertension control. Patient's troponins were within normal limit.  Unlikely symptoms due to ACS.   Dolphus Jenny, PA-C 04/30/23 1836    Laurence Spates, MD 05/01/23 [phone]

## 2023-04-30 NOTE — ED Triage Notes (Signed)
Pt present to ED from urgent care with c/o chest pain. Pt was seen at urgent care for allergic reaction, with facial and bottom lip swelling, onset yesterday beginning on left side of face radiating to right side of race. Pt c/o chest pain at urgent care, received 324mg  aspirin, 50mg  benadryl IM. Pt A&Ox3 at this time.
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
## 2023-04-30 NOTE — ED Notes (Addendum)
Patient is being discharged from the Urgent Care and sent to the Emergency Department via Ambulance (per provider) . Per Provider-R. Reggie Pile, patient is in need of higher level of care due to EKG Changes. Patient is aware and verbalizes understanding of plan of care. EMS called @ 1309. Vitals:   04/30/23 1301 04/30/23 1305  BP:    Pulse: 92 98  Resp: (!) 22 (!) 22  Temp:    SpO2: 95% 99%

## 2023-04-30 NOTE — ED Provider Notes (Signed)
[place] EMERGENCY DEPARTMENT AT [facility] Provider Note   CSN: [id number] Arrival date & time: 04/30/23  1408     History  Chief Complaint  Patient presents with   Chest Pain    Sheena Velasquez is a 67 y.o. female with a past medical history significant for hypertension, depression, asthma, and GERD who presents to the ED from urgent care due to possible allergic reaction and chest pain.  Patient states she developed edema around her lips yesterday.  Daughters at bedside states she gets intermittent edema frequently when she is "not feeling well".  No known allergies.  Patient ate from a taco truck last night however, had edema to lips prior to eating from taco truck and has had same food previously with no reaction.  Denies shortness of breath or feelings like her throat is closing up.  No nausea or vomiting.  No abdominal pain.  Patient also admits to some chest pain.  Evaluated at urgent care prior to arrival and sent to the ED to rule out ACS.  Patient given IM Benadryl and ASA at urgent care prior to arrival.  Patient is currently on lisinopril.  No new medications. Patient also admits to nasal congestion and cough x1 week.  No history of blood clots, recent surgeries, recent long immobilizations. Denies lower extremity edema.   History obtained from patient and past medical records. No interpreter used during encounter.     Home Medications Prior to Admission medications   Medication Sig Start Date End Date Taking? Authorizing Provider  Cholecalciferol (VITAMIN D PO) Take 500 Units by mouth daily.    [provider]  diphenhydrAMINE (BENADRYL) 25 mg capsule Take 25 mg by mouth every 6 (six) hours as needed for itching or allergies.    [provider]  felodipine (PLENDIL) 10 MG 24 hr tablet TAKE 1 TABLET(10 MG) BY MOUTH DAILY 01/22/23   Nafziger, Kandee Keen, NP  hydrochlorothiazide (HYDRODIURIL) 25 MG tablet TAKE 1 TABLET(25 MG) BY MOUTH DAILY 01/29/23    Nafziger, Kandee Keen, NP  lisinopril (ZESTRIL) 40 MG tablet TAKE 1 TABLET(40 MG) BY MOUTH DAILY 01/29/23   Nafziger, Kandee Keen, NP  Magnesium 250 MG TABS Take 1 tablet by mouth daily.    [provider]  meloxicam (MOBIC) 15 MG tablet Take 15 mg by mouth daily. 10/21/22   [provider]  naproxen (NAPROSYN) 500 MG tablet naproxen 500 mg tablet  TAKE 1 TABLET BY MOUTH TWICE DAILY    [provider]  omeprazole (PRILOSEC) 20 MG capsule TAKE 1 CAPSULE(20 MG) BY MOUTH TWICE DAILY BEFORE A MEAL 10/25/22   Nelwyn Salisbury, MD  VITAMIN D PO     [provider]      Allergies    Cefuroxime, Simvastatin, Sulfa antibiotics, and Mobic [meloxicam]    Review of Systems   Review of Systems  Respiratory:  Positive for cough. Negative for shortness of breath.   Cardiovascular:  Positive for chest pain.  Gastrointestinal:  Negative for abdominal pain.    Physical Exam Updated Vital Signs BP (!) 158/95 (BP Location: Right Arm)   Pulse 94   Temp (!) 97.5 F (36.4 C) (Oral)   Resp 18   Ht 5\' 2"  (1.575 m)   Wt 96 kg   SpO2 98%   BMI 38.71 kg/m  Physical Exam Vitals and nursing note reviewed.  Constitutional:      General: She is not in acute distress.    Appearance: She is not ill-appearing.  HENT:     Head: Normocephalic.     Mouth/Throat:     Comments: Edema to lips. See photos below. No edema to tongue. Airway patent.  Eyes:     Pupils: Pupils are equal, round, and reactive to light.  Cardiovascular:     Rate and Rhythm: Normal rate and regular rhythm.     Pulses: Normal pulses.     Heart sounds: Normal heart sounds. No murmur heard.    No friction rub. No gallop.  Pulmonary:     Effort: Pulmonary effort is normal.     Breath sounds: Normal breath sounds.     Comments: Respirations equal and unlabored, patient able to speak in full sentences, lungs clear to auscultation bilaterally Abdominal:     General: Abdomen is flat. There is no distension.      Palpations: Abdomen is soft.     Tenderness: There is no abdominal tenderness. There is no guarding or rebound.  Musculoskeletal:        General: Normal range of motion.     Cervical back: Neck supple.  Skin:    General: Skin is warm and dry.  Neurological:     General: No focal deficit present.     Mental Status: She is alert.  Psychiatric:        Mood and Affect: Mood normal.        Behavior: Behavior normal.     ED Results / Procedures / Treatments   Labs (all labs ordered are listed, but only abnormal results are displayed) Labs Reviewed  SARS CORONAVIRUS 2 BY RT PCR  CBC WITH DIFFERENTIAL/PLATELET  BASIC METABOLIC PANEL  TROPONIN I (HIGH SENSITIVITY)    EKG None  Radiology No results found.  Procedures Procedures    Medications Ordered in ED Medications  famotidine (PEPCID) IVPB 20 mg premix (has no administration in time range)  methylPREDNISolone sodium succinate (SOLU-MEDROL) 125 mg/2 mL injection 125 mg (has no administration in time range)    ED Course/ Medical Decision Making/ A&P                                 Medical Decision Making Amount and/or Complexity of Data Reviewed Independent Historian: caregiver    Details: Daughters at bedside provided history External Data Reviewed: notes.    Details: UC note Labs: ordered. Decision-making details documented in ED Course. Radiology: ordered and independent interpretation performed. Decision-making details documented in ED Course. ECG/medicine tests: ordered and independent interpretation performed. Decision-making details documented in ED Course.  Risk Prescription drug management.   This patient presents to the ED for concern of CP, this involves an extensive number of treatment options, and is a complaint that carries with it a high risk of complications and morbidity.  The differential diagnosis includes ACS, PE, aortic dissection, PNA, viral etiology, allergic reaction,  angioedema  67 year old female presents to the ED from urgent care due to chest pain and possible allergic reaction.  Patient has had URI symptoms for the past week.  Admits to intermittent swelling of lips.  Currently on lisinopril.  No known allergies.  Denies difficulties breathing.  No history of blood clots.  Upon arrival patient afebrile, not tachycardic or hypoxic.  Patient in no acute distress.  Edema to upper and lower lips as shown above.  No edema to tongue.  Airway patent.  Lungs clear to auscultation bilaterally.  No stridor or wheeze.  No evidence of  respiratory distress.  No lower extremity edema.  Negative Homan sign bilaterally.  Cardiac labs ordered to rule out ACS.  Questionable allergic reaction versus angioedema from lisinopril??  No evidence of respiratory distress.  Will hold epinephrine at this time.  IM Benadryl and ASA given at urgent care.  IV Solu-Medrol and Pepcid given.  COVID/flu to rule out infection.  Chest x-ray rule out evidence of pneumonia. Low suspicion for PE. Presentation non-concerning for aortic dissection.   Patient handed off to Melina Modena, PA-C at shift change pending labs and work-up.        Final Clinical Impression(s) / ED Diagnoses Final diagnoses:  Nonspecific chest pain    Rx / DC Orders ED Discharge Orders     None         Jesusita Oka 04/30/23 1510    Pricilla Loveless, MD 05/02/23 [phone]

## 2023-05-07 ENCOUNTER — Other Ambulatory Visit: Payer: Self-pay | Admitting: Adult Health

## 2023-05-07 ENCOUNTER — Encounter: Payer: Self-pay | Admitting: Adult Health

## 2023-05-07 ENCOUNTER — Ambulatory Visit (INDEPENDENT_AMBULATORY_CARE_PROVIDER_SITE_OTHER): Payer: Medicare Other | Admitting: Adult Health

## 2023-05-07 VITALS — BP 160/90 | HR 113 | Temp 98.4°F | Ht 62.0 in | Wt 212.0 lb

## 2023-05-07 DIAGNOSIS — I10 Essential (primary) hypertension: Secondary | ICD-10-CM

## 2023-05-07 DIAGNOSIS — J0140 Acute pansinusitis, unspecified: Secondary | ICD-10-CM | POA: Diagnosis not present

## 2023-05-07 MED ORDER — SPIRONOLACTONE 25 MG PO TABS: 25.0000 mg | ORAL_TABLET | Freq: Every day | ORAL | 1 refills | Status: DC

## 2023-05-07 MED ORDER — DOXYCYCLINE HYCLATE 100 MG PO CAPS: 100.0000 mg | ORAL_CAPSULE | Freq: Two times a day (BID) | ORAL | 0 refills | Status: DC
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
## 2023-05-07 NOTE — Patient Instructions (Signed)
I have sent in a prescription for Doxycycline to treat your sinus infection   I have also sent in a new BP medication called aldactone. Take this daily and follow up in 30 days or so for a physical exam

## 2023-05-24 ENCOUNTER — Other Ambulatory Visit: Payer: Self-pay | Admitting: Adult Health

## 2023-05-24 DIAGNOSIS — M79604 Pain in right leg: Secondary | ICD-10-CM

## 2023-05-24 DIAGNOSIS — M25551 Pain in right hip: Secondary | ICD-10-CM

## 2023-06-12 ENCOUNTER — Ambulatory Visit (INDEPENDENT_AMBULATORY_CARE_PROVIDER_SITE_OTHER): Payer: Medicare Other | Admitting: Adult Health

## 2023-06-12 ENCOUNTER — Other Ambulatory Visit: Payer: Self-pay

## 2023-06-12 ENCOUNTER — Encounter: Payer: Self-pay | Admitting: Adult Health

## 2023-06-12 VITALS — BP 150/98 | HR 92 | Temp 97.7°F | Ht 62.0 in | Wt 217.0 lb

## 2023-06-12 DIAGNOSIS — F5101 Primary insomnia: Secondary | ICD-10-CM | POA: Diagnosis not present

## 2023-06-12 DIAGNOSIS — I10 Essential (primary) hypertension: Secondary | ICD-10-CM

## 2023-06-12 DIAGNOSIS — J4540 Moderate persistent asthma, uncomplicated: Secondary | ICD-10-CM | POA: Diagnosis not present

## 2023-06-12 DIAGNOSIS — E782 Mixed hyperlipidemia: Secondary | ICD-10-CM | POA: Diagnosis not present

## 2023-06-12 DIAGNOSIS — Z6841 Body Mass Index (BMI) 40.0 and over, adult: Secondary | ICD-10-CM

## 2023-06-12 DIAGNOSIS — R7303 Prediabetes: Secondary | ICD-10-CM | POA: Diagnosis not present

## 2023-06-12 DIAGNOSIS — E66813 Obesity, class 3: Secondary | ICD-10-CM

## 2023-06-12 LAB — LIPID PANEL
Cholesterol: 211 mg/dL — ABNORMAL HIGH (ref 0–200)
HDL: 47.4 mg/dL (ref >39.00)
LDL Cholesterol: 146 mg/dL — ABNORMAL HIGH (ref 0–99)
NonHDL: 163.2
Total CHOL/HDL Ratio: 4
Triglycerides: 85 mg/dL (ref 0.0–149.0)
VLDL: 17 mg/dL (ref 0.0–40.0)

## 2023-06-12 LAB — CBC WITH DIFFERENTIAL/PLATELET
Basophils Absolute: 0 10*3/uL (ref 0.0–0.1)
Basophils Relative: 0.4 % (ref 0.0–3.0)
Eosinophils Absolute: 0.2 10*3/uL (ref 0.0–0.7)
Eosinophils Relative: 1.8 % (ref 0.0–5.0)
HCT: 42.7 % (ref 36.0–46.0)
Hemoglobin: 13.6 g/dL (ref 12.0–15.0)
Lymphocytes Relative: 15.8 % (ref 12.0–46.0)
Lymphs Abs: 1.4 10*3/uL (ref 0.7–4.0)
MCHC: 32 g/dL (ref 30.0–36.0)
MCV: 81.8 fL (ref 78.0–100.0)
Monocytes Absolute: 0.7 10*3/uL (ref 0.1–1.0)
Monocytes Relative: 7.2 % (ref 3.0–12.0)
Neutro Abs: 6.8 10*3/uL (ref 1.4–7.7)
Neutrophils Relative %: 74.8 % (ref 43.0–77.0)
Platelets: 326 10*3/uL (ref 150.0–400.0)
RBC: 5.22 Mil/uL — ABNORMAL HIGH (ref 3.87–5.11)
RDW: 15.4 % (ref 11.5–15.5)
WBC: 9.1 10*3/uL (ref 4.0–10.5)

## 2023-06-12 LAB — COMPREHENSIVE METABOLIC PANEL
ALT: 12 U/L (ref 0–35)
AST: 17 U/L (ref 0–37)
Albumin: 4.3 g/dL (ref 3.5–5.2)
Alkaline Phosphatase: 75 U/L (ref 39–117)
BUN: 16 mg/dL (ref 6–23)
CO2: 28 meq/L (ref 19–32)
Calcium: 9.4 mg/dL (ref 8.4–10.5)
Chloride: 105 meq/L (ref 96–112)
Creatinine, Ser: 0.99 mg/dL (ref 0.40–1.20)
GFR: 58.96 mL/min — ABNORMAL LOW (ref >60.00)
Glucose, Bld: 105 mg/dL — ABNORMAL HIGH (ref 70–99)
Potassium: 3.8 meq/L (ref 3.5–5.1)
Sodium: 140 meq/L (ref 135–145)
Total Bilirubin: 0.6 mg/dL (ref 0.2–1.2)
Total Protein: 7.3 g/dL (ref 6.0–8.3)

## 2023-06-12 LAB — TSH: TSH: 1.27 u[IU]/mL (ref 0.35–5.50)

## 2023-06-12 LAB — HEMOGLOBIN A1C: Hgb A1c MFr Bld: 6.2 % (ref 4.6–6.5)

## 2023-06-12 MED ORDER — ROSUVASTATIN CALCIUM 10 MG PO TABS: 10.0000 mg | ORAL_TABLET | Freq: Every day | ORAL | 3 refills | Status: AC

## 2023-06-12 MED ORDER — BLOOD PRESSURE CUFF MISC: 0 refills | Status: DC

## 2023-06-12 MED ORDER — TEMAZEPAM 15 MG PO CAPS
15.0000 mg | ORAL_CAPSULE | Freq: Every evening | ORAL | 2 refills | Status: DC | PRN
Start: 2023-06-12 — End: 2023-08-01
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
## 2023-06-12 NOTE — Patient Instructions (Signed)
 It was great seeing you today   We will follow up with you regarding your lab work   Please let me know if you need anything

## 2023-06-15 ENCOUNTER — Encounter: Payer: Self-pay | Admitting: Adult Health

## 2023-06-16 ENCOUNTER — Encounter: Payer: Self-pay | Admitting: Adult Health

## 2023-06-17 NOTE — Telephone Encounter (Addendum)
 Not sure if pt spoke to you about this at her CPE 06/12/2023. Please advise if pt needs a visit for this.

## 2023-07-11 ENCOUNTER — Ambulatory Visit: Payer: Medicare Other | Admitting: Adult Health

## 2023-07-11 NOTE — Progress Notes (Deleted)
   Subjective:    Patient ID: Sheena Velasquez, female    DOB: 04/03/1956, 68 y.o.   MRN: [id number]  HPI 68 year old female who  has a past medical history of Asthma, Chronic back pain, Depression, GERD (gastroesophageal reflux disease), and Hypertension.  She presents to the office today for 1 month follow-up regarding hypertension.  Months ago she was seen in the emergency room for suspected allergic reaction to lisinopril .  At this time she was placed on Aldactone  25 mg in addition to HCTZ 25 mg and Plendil  10 mg daily.  She was not checking her blood pressure at home and when she was seen a month ago she had not taken her blood pressure medication that morning.  She was advised to check her blood pressure at home and follow-up in 1 month.  BP Readings from Last 3 Encounters:  06/12/23 (!) 150/98  05/07/23 (!) 160/90  04/30/23 135/71      Review of Systems     Objective:   Physical Exam        Assessment & Plan:

## 2023-07-31 ENCOUNTER — Other Ambulatory Visit (HOSPITAL_COMMUNITY): Payer: Self-pay

## 2023-07-31 ENCOUNTER — Telehealth: Payer: Self-pay

## 2023-07-31 NOTE — Telephone Encounter (Signed)
.  Pharmacy Patient Advocate Encounter  Received notification from [facility] that Prior Authorization for Temazepam 15MG  capsules has been APPROVED from 06-04-2023 to 07-30-2024   PA #/Case ID/Reference #: [id number]

## 2023-07-31 NOTE — Telephone Encounter (Signed)
 Pharmacy Patient Advocate Encounter   Received notification from Onbase that prior authorization for Temazepam 15MG  capsules  is required/requested.   Insurance verification completed.   The patient is insured through [facility] .   Per test claim: PA required; PA submitted to above mentioned insurance via CoverMyMeds Key/confirmation #/EOC [id number] Status is pending

## 2023-07-31 NOTE — Telephone Encounter (Signed)
 Pharmacy Patient Advocate Encounter  Received notification from [facility] that Prior Authorization for Temazepam has been APPROVED from 06/04/2023 to 07/30/2024   PA #/Case ID/Reference #: Not provided, full approval letter attached in pt media

## 2023-08-01 ENCOUNTER — Other Ambulatory Visit: Payer: Self-pay | Admitting: Adult Health

## 2023-08-01 ENCOUNTER — Telehealth: Payer: Self-pay

## 2023-08-01 DIAGNOSIS — F5101 Primary insomnia: Secondary | ICD-10-CM

## 2023-08-01 MED ORDER — TEMAZEPAM 15 MG PO CAPS
15.0000 mg | ORAL_CAPSULE | Freq: Every evening | ORAL | 2 refills | Status: DC | PRN
Start: 2023-08-01 — End: 2024-02-03

## 2023-08-01 MED ORDER — TEMAZEPAM 15 MG PO CAPS: 15.0000 mg | ORAL_CAPSULE | Freq: Every evening | ORAL | 2 refills | Status: DC | PRN

## 2023-08-01 NOTE — Telephone Encounter (Signed)
 Can you please send in another Rx. Tried to call the pharmacy to see if the other ones are still on file but no answer.

## 2023-08-01 NOTE — Addendum Note (Signed)
 Addended by: Waymon Amato R on: 08/01/2023 04:50 PM   Modules accepted: Orders

## 2023-08-01 NOTE — Telephone Encounter (Signed)
 Copied from CRM [id number]. Topic: Clinical - Prescription Issue >> Aug 01, 2023  3:38 PM Adaysia C wrote: Reason for CRM: Patient called to inform the provider [facility] pharmacy is stating they have not recieved the approved authorization form for temazepam (RESTORIL). Please fax or refax the approved authorization form to [facility] Fax#: [phone]

## 2023-08-01 NOTE — Telephone Encounter (Signed)
 Pt advised of message below. Pt also want a Rx for zepbound. She stated that she would like [facility] to go [facility] [address]. Pt stated this was discussed with Kandee Keen.

## 2023-08-05 NOTE — Telephone Encounter (Signed)
 Patient notified of update  and verbalized understanding.

## 2023-08-08 ENCOUNTER — Other Ambulatory Visit: Payer: Self-pay | Admitting: Adult Health

## 2023-08-08 DIAGNOSIS — I10 Essential (primary) hypertension: Secondary | ICD-10-CM

## 2023-08-12 ENCOUNTER — Ambulatory Visit (INDEPENDENT_AMBULATORY_CARE_PROVIDER_SITE_OTHER): Payer: Medicare Other | Admitting: Family Medicine

## 2023-08-12 DIAGNOSIS — Z0000 Encounter for general adult medical examination without abnormal findings: Secondary | ICD-10-CM | POA: Diagnosis not present

## 2023-08-12 NOTE — Patient Instructions (Signed)
 I really enjoyed getting to talk with you today! I am available on Tuesdays and Thursdays for virtual visits if you have any questions or concerns, or if I can be of any further assistance.   CHECKLIST FROM ANNUAL WELLNESS VISIT:  -Follow up (please call to schedule if not scheduled after visit):   -yearly for annual wellness visit with primary care office  Here is a list of your preventive care/health maintenance measures and the plan for each if any are due:  PLAN For any measures below that may be due:  1) once you get the vaccines, please let us know so that we can update your record  Health Maintenance  Topic Date Due   Pneumonia Vaccine 37+ Years old (1 of 2 - PCV) Never done   DTaP/Tdap/Td (3 - Td or Tdap) 02/24/2021   COVID-19 Vaccine (6 - 2024-25 season) 02/02/2023   Zoster Vaccines- Shingrix (2 of 2) 08/11/2023   MAMMOGRAM  04/03/2024   Medicare Annual Wellness (AWV)  08/11/2024   Fecal DNA (Cologuard)  11/26/2024   INFLUENZA VACCINE  Completed   DEXA SCAN  Completed   Hepatitis C Screening  Completed   HPV VACCINES  Aged Out   Colonoscopy  Discontinued    -See a dentist at least yearly  -Get your eyes checked and then per your eye specialist's recommendations  -Other issues addressed today:   -I have included below further information regarding a healthy whole foods based diet, physical activity guidelines for adults, stress management and opportunities for social connections. I hope you find this information useful.   -----------------------------------------------------------------------------------------------------------------------------------------------------------------------------------------------------------------------------------------------------------    NUTRITION: -eat real food: lots of colorful vegetables (half the plate) and fruits -5-7 servings of vegetables and fruits per day (fresh or steamed is best), exp. 2 servings of vegetables with  lunch and dinner and 2 servings of fruit per day. Berries and greens such as kale and collards are great choices.  -consume on a regular basis:  fresh fruits, fresh veggies, fish, nuts, seeds, healthy oils (such as olive oil, avocado oil), whole grains (make sure for bread/pasta/crackers/etc., that the first ingredient on label contains the word "whole"), legumes. -can eat small amounts of dairy and lean meat (no larger than the palm of your hand), but avoid processed meats such as ham, bacon, lunch meat, etc. -drink water -try to avoid fast food and pre-packaged foods, processed meat, ultra processed foods/beverages (donuts, candy, etc.) -most experts advise limiting sodium to < 2300mg  per day, should limit further is any chronic conditions such as high blood pressure, heart disease, diabetes, etc. The American Heart Association advised that < 1500mg  is is ideal -try to avoid foods/beverages that contain any ingredients with names you do not recognize  -try to avoid foods/beverages  with added sugar or sweeteners/sweets  -try to avoid sweet drinks (including diet drinks): soda, juice, Gatorade, sweet tea, power drinks, diet drinks -try to avoid white rice, white bread, pasta (unless whole grain)  EXERCISE GUIDELINES FOR ADULTS: -if you wish to increase your physical activity, do so gradually and with the approval of your doctor -STOP and seek medical care immediately if you have any chest pain, chest discomfort or trouble breathing when starting or increasing exercise  -move and stretch your body, legs, feet and arms when sitting for long periods -Physical activity guidelines for optimal health in adults: -get at least 150 minutes per week of moderate exercise (can talk, but not sing); this is about 20-30 minutes of sustained activity 5-7  days per week or two 10-15 minute episodes of sustained activity 5-7 days per week -do some muscle building/resistance training/strength training at least 2 days  per week  -balance exercises 3+ days per week:   Stand somewhere where you have something sturdy to hold onto if you lose balance    1) lift up on toes, then back down, start with 5x per day and work up to 20x   2) stand and lift one leg straight out to the side so that foot is a few inches of the floor, start with 5x each side and work up to 20x each side   3) stand on one foot, start with 5 seconds each side and work up to 20 seconds on each side  If you need ideas or help with getting more active:  -Silver sneakers https://tools.silversneakers.com  -Walk with a Doc: http://www.duncan-williams.com/  -try to include resistance (weight lifting/strength building) and balance exercises twice per week: or the following link for ideas: http://castillo-powell.com/  BuyDucts.dk  STRESS MANAGEMENT: -can try meditating, or just sitting quietly with deep breathing while intentionally relaxing all parts of your body for 5 minutes daily -if you need further help with stress, anxiety or depression please follow up with your primary doctor or contact the wonderful folks at [facility]: [phone]  SOCIAL CONNECTIONS: -options in [place] if you wish to engage in more social and exercise related activities:  -Silver sneakers https://tools.silversneakers.com  -Walk with a Doc: http://www.duncan-williams.com/  -Check out the [facility] Active Adults 50+ section on the [employer] [employer] (hiking clubs, book clubs, cards and games, chess, exercise classes, aquatic classes and much more) - see the website for details: https://www.[place].gov/departments/parks-recreation/active-adults50  -YouTube has lots of exercise videos for different ages and abilities as well  -[facility] (a variety of indoor and outdoor inperson activities for adults). [phone]. [address]  -Virtual Online Classes (a variety of topics): see seniorplanet.org or call [phone]  -consider volunteering at a school, hospice center, church, senior center or elsewhere          ADVANCED HEALTHCARE DIRECTIVES:  [place] Advanced Directives assistance:   ExpressWeek.com.cy  Everyone should have advanced health care directives in place. This is so that you get the care you want, should you ever be in a situation where you are unable to make your own medical decisions.   From the [place] Advanced Directive Website: "Advance Health Care Directives are legal documents in which you give written instructions about your health care if, in the future, you cannot speak for yourself.   A health care power of attorney allows you to name a person you trust to make your health care decisions if you cannot make them yourself. A declaration of a desire for a natural death (or living will) is document, which states that you desire not to have your life prolonged by extraordinary measures if you have a terminal or incurable illness or if you are in a vegetative state. An advance instruction for mental health treatment makes a declaration of instructions, information and preferences regarding your mental health treatment. It also states that you are aware that the advance instruction authorizes a mental health treatment provider to act according to your wishes. It may also outline your consent or refusal of mental health treatment. A declaration of an anatomical gift allows anyone over the age of 93 to make a gift by will, organ donor card or other document."   Please see the following website  or an elder Sales executive for forms, FAQs and for completion of advanced directives: [place] TEFL teacher Health Care Directives Advance Health Care Directives (http://guzman.com/)  Or copy and paste the following to your web  browser: PoshChat.fi
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
## 2023-09-03 ENCOUNTER — Encounter: Payer: Self-pay | Admitting: Adult Health

## 2023-10-07 ENCOUNTER — Other Ambulatory Visit: Payer: Self-pay | Admitting: Family Medicine

## 2023-10-07 DIAGNOSIS — K2100 Gastro-esophageal reflux disease with esophagitis, without bleeding: Secondary | ICD-10-CM

## 2023-10-15 ENCOUNTER — Other Ambulatory Visit: Payer: Self-pay | Admitting: Adult Health

## 2023-10-15 DIAGNOSIS — I10 Essential (primary) hypertension: Secondary | ICD-10-CM

## 2024-01-23 ENCOUNTER — Encounter: Payer: Self-pay | Admitting: Adult Health

## 2024-02-03 ENCOUNTER — Encounter: Payer: Self-pay | Admitting: Adult Health

## 2024-02-03 ENCOUNTER — Ambulatory Visit: Admitting: Adult Health

## 2024-02-03 VITALS — BP 138/86 | HR 87 | Temp 98.0°F | Ht 62.0 in | Wt 210.0 lb

## 2024-02-03 DIAGNOSIS — F5101 Primary insomnia: Secondary | ICD-10-CM

## 2024-02-03 DIAGNOSIS — I10 Essential (primary) hypertension: Secondary | ICD-10-CM | POA: Diagnosis not present

## 2024-02-03 DIAGNOSIS — E66812 Obesity, class 2: Secondary | ICD-10-CM | POA: Diagnosis not present

## 2024-02-03 DIAGNOSIS — R7303 Prediabetes: Secondary | ICD-10-CM

## 2024-02-03 DIAGNOSIS — Z6838 Body mass index (BMI) 38.0-38.9, adult: Secondary | ICD-10-CM

## 2024-02-03 LAB — POCT GLYCOSYLATED HEMOGLOBIN (HGB A1C): Hemoglobin A1C: 6.1 % — AB (ref 4.0–5.6)

## 2024-02-03 MED ORDER — SPIRONOLACTONE 25 MG PO TABS: 25.0000 mg | ORAL_TABLET | Freq: Every day | ORAL | 1 refills | Status: AC

## 2024-02-03 MED ORDER — HYDROCHLOROTHIAZIDE 25 MG PO TABS
ORAL_TABLET | ORAL | 1 refills | Status: AC
Start: 2024-02-03 — End: ?

## 2024-02-03 MED ORDER — TEMAZEPAM 15 MG PO CAPS: 15.0000 mg | ORAL_CAPSULE | Freq: Every evening | ORAL | 2 refills | Status: AC | PRN

## 2024-02-03 MED ORDER — ZEPBOUND 2.5 MG/0.5ML ~~LOC~~ SOAJ
2.5000 mg | SUBCUTANEOUS | 0 refills | Status: AC
Start: 2024-02-03 — End: 2024-03-04

## 2024-02-03 MED ORDER — FELODIPINE ER 10 MG PO TB24
ORAL_TABLET | ORAL | 1 refills | Status: AC
Start: 2024-02-03 — End: ?

## 2024-02-03 MED ORDER — ONDANSETRON HCL 4 MG PO TABS
4.0000 mg | ORAL_TABLET | Freq: Three times a day (TID) | ORAL | 0 refills | Status: AC | PRN
Start: 2024-02-03 — End: ?

## 2024-02-03 NOTE — Patient Instructions (Signed)
 Health Maintenance Due  Topic Date Due   DTaP/Tdap/Td (3 - Td or Tdap) 02/24/2021   INFLUENZA VACCINE  01/02/2024   COVID-19 Vaccine (6 - 2025-26 season) 02/02/2024       08/12/2023    6:05 PM 06/12/2023   10:47 AM 11/19/2021    1:14 PM  Depression screen PHQ 2/9  Decreased Interest 0 0 0  Down, Depressed, Hopeless 1 0 0  PHQ - 2 Score 1 0 0
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
## 2024-02-18 ENCOUNTER — Encounter: Payer: Self-pay | Admitting: Adult Health

## 2024-02-19 ENCOUNTER — Other Ambulatory Visit (HOSPITAL_COMMUNITY): Payer: Self-pay

## 2024-02-19 ENCOUNTER — Telehealth: Payer: Self-pay

## 2024-02-19 NOTE — Telephone Encounter (Signed)
 Noted

## 2024-03-03 ENCOUNTER — Encounter: Payer: Self-pay | Admitting: Adult Health

## 2024-03-04 NOTE — Telephone Encounter (Signed)
 This has been addressed. Pt advised that a PA was started but no update was provided. I informed her that I reach out to our PA team for update. Pt verbalized understanding.

## 2024-03-05 ENCOUNTER — Other Ambulatory Visit (HOSPITAL_COMMUNITY): Payer: Self-pay

## 2024-03-05 NOTE — Telephone Encounter (Signed)
 Pharmacy Patient Advocate Encounter  Received notification from [facility] that Prior Authorization for Zepbound   has been DENIED.  Full denial letter will be uploaded to the media tab. See denial reason below.     PA #/Case ID/Reference #: [id number]

## 2024-03-05 NOTE — Telephone Encounter (Signed)
 Pharmacy Patient Advocate Encounter   Received notification from Pt Calls Messages that prior authorization for Zepbound  2.5 is required/requested.   Insurance verification completed.   The patient is insured through [facility].   Per test claim: PA required; PA submitted to above mentioned insurance via Latent Key/confirmation #/EOC [id number] Status is pending   Spoke with cynthia at [employer]. Previous PA request was cancelled on 02/23/24. Resubmitted

## 2024-03-05 NOTE — Telephone Encounter (Signed)
 Noted! Thank you

## 2024-03-05 NOTE — Telephone Encounter (Signed)
 Additional information has been requested from the patient's insurance in order to proceed with the prior authorization request. Requested information has been sent, or form has been filled out and faxed back to [phone]

## 2024-03-08 ENCOUNTER — Other Ambulatory Visit (HOSPITAL_COMMUNITY): Payer: Self-pay

## 2024-03-18 ENCOUNTER — Encounter: Payer: Self-pay | Admitting: Adult Health

## 2024-05-24 ENCOUNTER — Encounter: Payer: Self-pay | Admitting: Adult Health
